# Patient Record
Sex: Female | Born: 1945 | Race: White | Hispanic: No | Marital: Single | Smoking: Former smoker
Health system: Southern US, Community
[De-identification: ages and names within clinical notes are randomized; demographics above are authoritative.]

## PROBLEM LIST (undated history)

## (undated) DIAGNOSIS — T7840XA Allergy, unspecified, initial encounter: Secondary | ICD-10-CM

## (undated) DIAGNOSIS — E785 Hyperlipidemia, unspecified: Secondary | ICD-10-CM

## (undated) DIAGNOSIS — G4730 Sleep apnea, unspecified: Secondary | ICD-10-CM

## (undated) DIAGNOSIS — F419 Anxiety disorder, unspecified: Secondary | ICD-10-CM

## (undated) DIAGNOSIS — R519 Headache, unspecified: Secondary | ICD-10-CM

## (undated) DIAGNOSIS — G43909 Migraine, unspecified, not intractable, without status migrainosus: Secondary | ICD-10-CM

## (undated) HISTORY — DX: Hyperlipidemia, unspecified: E78.5

## (undated) HISTORY — DX: Sleep apnea, unspecified: G47.30

## (undated) HISTORY — PX: ABDOMINAL HYSTERECTOMY: SHX81

## (undated) HISTORY — DX: Migraine, unspecified, not intractable, without status migrainosus: G43.909

## (undated) HISTORY — DX: Headache, unspecified: R51.9

## (undated) HISTORY — DX: Anxiety disorder, unspecified: F41.9

## (undated) HISTORY — DX: Allergy, unspecified, initial encounter: T78.40XA

---

## 1987-02-07 HISTORY — PX: BACK SURGERY: SHX140

## 2013-09-13 HISTORY — PX: HERNIA REPAIR: SHX51

## 2019-01-15 HISTORY — PX: REPLACEMENT TOTAL KNEE: SUR1224

## 2019-11-10 ENCOUNTER — Other Ambulatory Visit: Payer: Self-pay

## 2019-11-10 ENCOUNTER — Encounter: Payer: Self-pay | Admitting: Family Medicine

## 2019-11-10 ENCOUNTER — Ambulatory Visit (INDEPENDENT_AMBULATORY_CARE_PROVIDER_SITE_OTHER): Payer: Medicare Other | Admitting: Family Medicine

## 2019-11-10 VITALS — BP 113/74 | HR 73 | Temp 97.7°F | Resp 16 | Ht 61.0 in | Wt 228.6 lb

## 2019-11-10 DIAGNOSIS — Z1231 Encounter for screening mammogram for malignant neoplasm of breast: Secondary | ICD-10-CM

## 2019-11-10 DIAGNOSIS — Z23 Encounter for immunization: Secondary | ICD-10-CM | POA: Diagnosis not present

## 2019-11-10 DIAGNOSIS — G4733 Obstructive sleep apnea (adult) (pediatric): Secondary | ICD-10-CM

## 2019-11-10 DIAGNOSIS — Z7689 Persons encountering health services in other specified circumstances: Secondary | ICD-10-CM

## 2019-11-10 DIAGNOSIS — M810 Age-related osteoporosis without current pathological fracture: Secondary | ICD-10-CM

## 2019-11-10 DIAGNOSIS — M159 Polyosteoarthritis, unspecified: Secondary | ICD-10-CM

## 2019-11-10 DIAGNOSIS — M8949 Other hypertrophic osteoarthropathy, multiple sites: Secondary | ICD-10-CM

## 2019-11-10 DIAGNOSIS — F3342 Major depressive disorder, recurrent, in full remission: Secondary | ICD-10-CM

## 2019-11-10 DIAGNOSIS — R0600 Dyspnea, unspecified: Secondary | ICD-10-CM

## 2019-11-10 DIAGNOSIS — R0609 Other forms of dyspnea: Secondary | ICD-10-CM

## 2019-11-10 DIAGNOSIS — Z6841 Body Mass Index (BMI) 40.0 and over, adult: Secondary | ICD-10-CM

## 2019-11-10 DIAGNOSIS — R0789 Other chest pain: Secondary | ICD-10-CM

## 2019-11-10 MED ORDER — ALENDRONATE SODIUM 70 MG PO TABS: 70.0000 mg | ORAL_TABLET | ORAL | 3 refills | Status: DC

## 2019-11-10 NOTE — Assessment & Plan Note (Signed)
Controlled, in remission Continue Duloxetine 20mg  BID, checked prior meds Will refill when ready

## 2019-11-10 NOTE — Assessment & Plan Note (Signed)
Known OSA, not on CPAP Currently doing well without complications Reconsider future PSG

## 2019-11-10 NOTE — Assessment & Plan Note (Signed)
Chronic problem osteoporosis Lumbar spine Overdue for DEXA On 3 years+ of bisphosphonate oral Alendronate 70mg  weekly, re order today, discussion on timeline 5 years then drug holiday and re-eval - Ordered DEXA [facility]

## 2019-11-10 NOTE — Assessment & Plan Note (Addendum)
Chronic problem, multiple joints Chronic pain underlying Knees, back primarily S/p L knee replacement future R knee likely need surgery Advised safe Tylenol dosing PRN

## 2019-11-10 NOTE — Progress Notes (Addendum)
Subjective:    Patient ID: Diana Mcneil, female    DOB: [birth year], 74 y.o.   MRN: [id number]  Quaneisha Hanisch is a 74 y.o. female presenting on 11/10/2019 for Obesity and Osteoporosis  Previously PCP Dr Jefm Bryant in [place]. Request records. She was living alone for while, previous ex-husband. Now has new partner and doing very well, they have moved to this area by preference.   HPI   Morbid obesity BMI >43 Atypical Chest Pain / DOE Fam history heart disease  Reports today that she has had history of prior stress test work up years ago in [place] mostly unremarkable, and now she requests re-evaluation. She admits with her age and weight she would like cardiac evaluation. 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 On Duloxetine 20mg  BID, in remission, no significant concerns.  Migraines Sumatriptan PRN  Allergies Off Xyzal On Singulair, Flonase   Health Maintenance: Declines COVID19 vaccine.  Last mammogram years ago.  Unsure last date of PNA vaccine, she thinks after age 58+, will request record from previous PCP.  Due for Flu Shot, will receive today    last colonoscopy 3-4 year ago was told last one. Need record  Depression screen [facility] 2/9 11/10/2019  Decreased Interest 0  Down, Depressed, Hopeless 0  PHQ - 2 Score 0  Altered  sleeping 1  Tired, decreased energy 3  Change in appetite 0  Feeling bad or failure about yourself  0  Trouble concentrating 0  Moving slowly or fidgety/restless 0  Suicidal thoughts 0  PHQ-9 Score 4  Difficult doing work/chores Not difficult at all     GAD 7 : Generalized Anxiety Score 11/10/2019  Nervous, Anxious, on Edge 0  Control/stop worrying 0  Worry too much - different things 0  Trouble relaxing 0  Restless 0  Easily annoyed or irritable 0  Afraid - awful might happen 0  Total GAD 7 Score 0  Anxiety Difficulty Not difficult at all        Past Medical History:  Diagnosis Date  . Allergy   . Anxiety   . Headache   . Hyperlipidemia   . Migraine   . Sleep apnea    Past Surgical History:  Procedure Laterality Date  . ABDOMINAL HYSTERECTOMY     partial  . BACK SURGERY  1989  . HERNIA REPAIR  09/13/2013  . 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 Marital status: Single    Spouse name: Not on file  . Number of children: Not on file  . Years of education: high school  . Highest education level: High school graduate  Occupational History  . Not on file  Tobacco Use  . Smoking status: Former Smoker    Packs/day: 0.50    Years: 40.00    Pack years: 20.00  Types: Cigarettes  . Smokeless tobacco: Former Network engineer and Sexual Activity  . Alcohol use: Never  . Drug use: Never  . Sexual activity: Not on file  Other Topics Concern  . Not on file  Social History Narrative  . Not on file   Social Determinants of Health   Financial Resource Strain:   . Difficulty of Paying Living Expenses: Not on file  Food Insecurity:   . Worried About Charity fundraiser in the Last Year: Not on file  . Ran Out of Food in the Last Year: Not on file  Transportation Needs:   . Lack of Transportation (Medical): Not on file  . Lack of Transportation (Non-Medical): Not on file  Physical Activity:   . Days of Exercise per  Week: Not on file  . Minutes of Exercise per Session: Not on file  Stress:   . Feeling of Stress : Not on file  Social Connections:   . Frequency of Communication with Friends and Family: Not on file  . Frequency of Social Gatherings with Friends and Family: Not on file  . Attends Religious Services: Not on file  . Active Member of Clubs or Organizations: Not on file  . Attends Archivist Meetings: Not on file  . Marital Status: Not on file  Intimate Partner Violence:   . Fear of Current or Ex-Partner: Not on file  . Emotionally Abused: Not on file  . Physically Abused: Not on file  . Sexually Abused: Not on file   Family History  Problem Relation Age of Onset  . Diabetes Mother   . Heart disease Mother   . Stroke Mother   . CAD Mother   . Diabetes Father    Current Outpatient Medications on File Prior to Visit  Medication Sig  . ascorbic acid (VITAMIN C) 500 MG tablet Take by mouth.  Marland Kitchen aspirin 81 MG EC tablet Take by mouth.  . Cholecalciferol 25 MCG (1000 UT) tablet Take by mouth.  . DULoxetine (CYMBALTA) 20 MG capsule Take 20 mg by mouth in the morning and at bedtime.  . fluticasone (FLONASE) 50 MCG/ACT nasal spray Place 2 sprays into both nostrils daily.  . montelukast (SINGULAIR) 10 MG tablet Take 10 mg by mouth daily.  . pantoprazole (PROTONIX) 40 MG tablet Take 40 mg by mouth daily.  . rosuvastatin (CRESTOR) 20 MG tablet Crestor  . SUMAtriptan (IMITREX) 100 MG tablet    No current facility-administered medications on file prior to visit.    Review of Systems Per HPI unless specifically indicated above     Objective:    BP 113/74   Pulse 73   Temp 97.7 F (36.5 C) (Temporal)   Resp 16   Ht 5\' 1"  (1.549 m)   Wt 228 lb 9.6 oz (103.7 kg)   SpO2 97%   BMI 43.19 kg/m   Wt Readings from Last 3 Encounters:  11/10/19 228 lb 9.6 oz (103.7 kg)    Physical Exam Vitals and nursing note reviewed.  Constitutional:      General: She is not in acute  distress.    Appearance: She is well-developed. She is obese. She is not diaphoretic.     Comments: Well-appearing, comfortable, cooperative  HENT:     Head: Normocephalic and atraumatic.  Eyes:     General:        Right eye: No discharge.        Left eye: No discharge.     Conjunctiva/sclera:  Conjunctivae normal.  Neck:     Thyroid: No thyromegaly.  Cardiovascular:     Rate and Rhythm: Normal rate and regular rhythm.     Heart sounds: Normal heart sounds. No murmur heard.   Pulmonary:     Effort: Pulmonary effort is normal. No respiratory distress.     Breath sounds: Normal breath sounds. No wheezing or rales.  Chest:     Chest wall: No tenderness.  Musculoskeletal:        General: Normal range of motion.     Cervical back: Normal range of motion and neck supple.     Right lower leg: Edema (varicose veins) present.     Left lower leg: Edema (varicose veins) present.     Comments: Uses cane  Lymphadenopathy:     Cervical: No cervical adenopathy.  Skin:    General: Skin is warm and dry.     Findings: No erythema or rash.  Neurological:     Mental Status: She is alert and oriented to person, place, and time.  Psychiatric:        Behavior: Behavior normal.     Comments: Well groomed, good eye contact, normal speech and thoughts    No results found for this or any previous visit.    Assessment & Plan:   Problem List Items Addressed This Visit    Primary osteoarthritis involving multiple joints    Chronic problem, multiple joints Chronic pain underlying Knees, back primarily S/p L knee replacement future R knee likely need surgery Advised safe Tylenol dosing PRN      Relevant Medications   aspirin 81 MG EC tablet   Osteoporosis of lumbar spine - Primary    Chronic problem osteoporosis Lumbar spine Overdue for DEXA On 3 years+ of bisphosphonate oral Alendronate 70mg  weekly, re order today, discussion on timeline 5 years then drug holiday and re-eval - Ordered DEXA  [facility]      Relevant Medications   Cholecalciferol 25 MCG (1000 UT) tablet   alendronate (FOSAMAX) 70 MG tablet   Other Relevant Orders   DG Bone Density   OSA (obstructive sleep apnea)    Known OSA, not on CPAP Currently doing well without complications Reconsider future PSG      Morbid obesity with BMI of 40.0-44.9, adult (HCC)    Encourage lifestyle diet exercise wt loss as tolerated      Relevant Orders   Ambulatory referral to Cardiology   Major depression, recurrent, full remission ([place])    Controlled, in remission Continue Duloxetine 20mg  BID, checked prior meds Will refill when ready      Relevant Medications   DULoxetine (CYMBALTA) 20 MG capsule    Other Visit Diagnoses    Encounter to establish care with new doctor       Needs flu shot       Relevant Orders   Flu Vaccine QUAD High Dose(Fluad) (Completed)   Encounter for screening mammogram for malignant neoplasm of breast       Relevant Orders   MM DIGITAL SCREENING BILATERAL   Atypical chest pain       Relevant Orders   Ambulatory referral to Cardiology   Dyspnea on exertion       Relevant Orders   Ambulatory referral to Cardiology      #Dyspnea on Exertion, Atypical CP Refer to Cardiology [facility] given her constellation of symptoms, with risk factors age 62, morbid obesity and fam history of heart disease.  Ordered mammogram screening  Request  all records from prior PCP Dr Carley Hammed in [place], and Dr Riley Lam - [place] (GI specialist)    Orders Placed This Encounter  Procedures  . MM DIGITAL SCREENING BILATERAL    Standing Status:   Future    Standing Expiration Date:   05/10/2020    Order Specific Question:   Reason for Exam (SYMPTOM  OR DIAGNOSIS REQUIRED)    Answer:   Routine screening bilateral mammogram. May change to Bilateral MM Tomo screening if indicated and appropriate for patient/insurance    Order Specific Question:   Preferred imaging location?    Answer:   [facility]  . DG Bone Density    Standing Status:   Future    Standing Expiration Date:   11/09/2020    Order Specific Question:   Reason for Exam (SYMPTOM  OR DIAGNOSIS REQUIRED)    Answer:   osteoporosis lumbar spine, on alendronate for 3 years +    Order Specific Question:   Preferred imaging location?    Answer:   [facility]  . Flu Vaccine QUAD High Dose(Fluad)  . Ambulatory referral to Cardiology    Referral Priority:   Routine    Referral Type:   Consultation    Referral Reason:   Specialty Services Required    Requested Specialty:   Cardiology    Number of Visits Requested:   1     Meds ordered this encounter  Medications  . alendronate (FOSAMAX) 70 MG tablet    Sig: Take 1 tablet (70 mg total) by mouth once a week. Take with a full glass of water on an empty stomach.    Dispense:  12 tablet    Refill:  3      Follow up plan: Return in about 4 weeks (around 12/08/2019) for 4 week follow-up review records, cardiology f/u, AM apt may do fasting lab after.  Nobie Putnam, DO [facility] 11/10/2019, 10:44 AM

## 2019-11-10 NOTE — Patient Instructions (Addendum)
Thank you for coming to the office today.  Recommend to start taking Tylenol Extra Strength 500mg  tabs - take 1 to 2 tabs per dose (max 1000mg ) every 6-8 hours for pain (take regularly, don't skip a dose for next 7 days), max 24 hour daily dose is 6 tablets or 3000mg . In the future you can repeat the same everyday Tylenol course for 1-2 weeks at a time.    For Mammogram screening for breast cancer / DEXA Scan (Bone mineral density) screening for osteoporosis  Call the [place] below anytime to schedule your own appointment now that order has been placed.  [facility] [address] [place], [place] [address] Phone: [phone]  ---------------------------------------------  Stay tuned for apt  [facility]) HeartCare at [place],  [address] Main: [phone]   DUE for [place] (no food or drink after midnight before the lab appointment, only water or coffee without cream/sugar on the morning of)   Please schedule a Follow-up Appointment to: Return in about 4 weeks (around 12/08/2019) for 4 week follow-up review records, cardiology f/u, AM apt may do fasting lab after.  If you have any other questions or concerns, please feel free to call the office or send a message through [place]. You may also schedule an earlier appointment if necessary.  Additionally, you may be receiving a survey about your experience at our office within a few days to 1 week by e-mail or mail. We value your feedback.  Diana Putnam, DO [place]

## 2019-11-10 NOTE — Assessment & Plan Note (Signed)
Encourage lifestyle diet exercise wt loss as tolerated

## 2019-11-19 DIAGNOSIS — M1711 Unilateral primary osteoarthritis, right knee: Secondary | ICD-10-CM | POA: Diagnosis not present

## 2019-12-01 ENCOUNTER — Ambulatory Visit: Payer: Medicare Other | Admitting: Cardiology

## 2019-12-11 LAB — HEMOGLOBIN A1C: Hemoglobin A1C: 5.1

## 2019-12-12 ENCOUNTER — Ambulatory Visit: Payer: Self-pay | Admitting: Family Medicine

## 2019-12-16 ENCOUNTER — Encounter: Payer: Self-pay | Admitting: Family Medicine

## 2019-12-19 ENCOUNTER — Other Ambulatory Visit: Payer: Self-pay

## 2019-12-19 ENCOUNTER — Ambulatory Visit (INDEPENDENT_AMBULATORY_CARE_PROVIDER_SITE_OTHER): Payer: Medicare Other | Admitting: Family Medicine

## 2019-12-19 ENCOUNTER — Encounter: Payer: Self-pay | Admitting: Family Medicine

## 2019-12-19 ENCOUNTER — Ambulatory Visit: Payer: Self-pay | Admitting: Family Medicine

## 2019-12-19 VITALS — BP 129/67 | HR 62 | Temp 97.7°F | Resp 16 | Ht 61.0 in | Wt 227.0 lb

## 2019-12-19 DIAGNOSIS — M25561 Pain in right knee: Secondary | ICD-10-CM | POA: Diagnosis not present

## 2019-12-19 DIAGNOSIS — M1711 Unilateral primary osteoarthritis, right knee: Secondary | ICD-10-CM | POA: Diagnosis not present

## 2019-12-19 DIAGNOSIS — G8929 Other chronic pain: Secondary | ICD-10-CM | POA: Diagnosis not present

## 2019-12-19 MED ORDER — GABAPENTIN 100 MG PO CAPS: ORAL_CAPSULE | ORAL | 1 refills | Status: DC

## 2019-12-19 NOTE — Patient Instructions (Addendum)
Thank you for coming to the office today.  Start Gabapentin 100mg  capsules, take at night for 2-3 nights only, and then increase to 2 times a day for a few days, and then may increase to 3 times a day, it may make you drowsy, if helps significantly at night only, then you can increase instead to 3 capsules at night, instead of 3 times a day  - In the future if needed, we can significantly increase the dose if tolerated well, some common doses are 300mg  three times a day up to 600mg  three times a day, usually it takes several weeks or months to get to higher doses  Try Prevagen.   Please schedule a Follow-up Appointment to: Return in about 3 months (around 03/20/2020) for 3 month follow-up Arthritis, Pain.  If you have any other questions or concerns, please feel free to call the office or send a message through [place]. You may also schedule an earlier appointment if necessary.  Additionally, you may be receiving a survey about your experience at our office within a few days to 1 week by e-mail or mail. We value your feedback.  Nobie Putnam, DO [place]

## 2019-12-19 NOTE — Progress Notes (Signed)
Subjective:    Patient ID: Diana Mcneil, female    DOB: 05/06/1945, 74 y.o.   MRN: [id number]  Diana Mcneil is a 74 y.o. female presenting on 12/19/2019 for Arthritis (knee pain bilateral)   HPI   RIGHT knee pain, chronic / osteoarthritis Chronic Pain / Osteoarthritis multiple joints  History - December 2020, s/p Left knee replaced Previously discussed - She needs to get R knee replaced - has chronic pain  Admits chronic back pain and prior scoliosis She uses cane for ambulation  Had prior x-rays of Right knee, very severe, and had injection cortisone R knee ineffective only lasted 3 weeks, no further injections would need surgery in future. Chronic low back pain - had imaging. Uses cane for ambulation Worse if rains has severe arthritis pain Previously on Tramadol, has no longer been helpful. She is hesitant about stronger medication. She plans to continue to see Dr Clarise Cruz at [facility] ([place]) and plans to do surgery at [facility] and PT locally here in [place]. Next apt with Orthopedic is next month in December 2021 She is taking Duloxetine for  Mood and pain. She has taken Oxycodone in past from prior surgery. She is not interested in other opiate or stronger med. She asks for other options. Has tried most NSAID therapy in past.  Atypical Chest Pain / DOE Fam history heart disease - Had apt with Cardiology but missed due to arrived late, she re-scheduled now for Monday 11/15    Depression screen PHQ 2/9 11/10/2019  Decreased Interest 0  Down, Depressed, Hopeless 0  PHQ - 2 Score 0  Altered sleeping 1  Tired, decreased energy 3  Change in appetite 0  Feeling bad or failure about yourself  0  Trouble concentrating 0  Moving slowly or fidgety/restless 0  Suicidal thoughts 0  PHQ-9 Score 4  Difficult doing work/chores Not difficult at all    Social History   Tobacco Use  . Smoking status: Former Smoker    Packs/day: 0.50    Years: 40.00    Pack years:  20.00    Types: Cigarettes  . Smokeless tobacco: Former Network engineer Use Topics  . Alcohol use: Never  . Drug use: Never    Review of Systems Per HPI unless specifically indicated above     Objective:    BP 129/67   Pulse 62   Temp 97.7 F (36.5 C) (Temporal)   Resp 16   Ht 5\' 1"  (1.549 m)   Wt 227 lb (103 kg)   SpO2 95%   BMI 42.89 kg/m   Wt Readings from Last 3 Encounters:  12/19/19 227 lb (103 kg)  11/10/19 228 lb 9.6 oz (103.7 kg)    Physical Exam Vitals and nursing note reviewed.  Constitutional:      General: She is not in acute distress.    Appearance: She is well-developed. She is obese. She is not diaphoretic.     Comments: Well-appearing, comfortable, cooperative  HENT:     Head: Normocephalic and atraumatic.  Eyes:     General:        Right eye: No discharge.        Left eye: No discharge.     Conjunctiva/sclera: Conjunctivae normal.  Cardiovascular:     Rate and Rhythm: Normal rate.  Pulmonary:     Effort: Pulmonary effort is normal.  Genitourinary:    Rectum: Guaiac stool:    Musculoskeletal:     Comments: Right Knee very bulky  appearance, effusion, tender to palpation joint line. Uses cane for ambulation. Crepitus on exam ROM.  Skin:    General: Skin is warm and dry.     Findings: No erythema or rash.  Neurological:     Mental Status: She is alert and oriented to person, place, and time.  Psychiatric:        Behavior: Behavior normal.     Comments: Well groomed, good eye contact, normal speech and thoughts    Results for orders placed or performed in visit on 12/16/19  Hemoglobin A1c  Result Value Ref Range   Hemoglobin A1C 5.1       Assessment & Plan:   Problem List Items Addressed This Visit    Primary osteoarthritis of right knee   Relevant Medications   gabapentin (NEURONTIN) 100 MG capsule   Chronic pain of right knee - Primary   Relevant Medications   gabapentin (NEURONTIN) 100 MG capsule      Subacute on chronic R  generalized Knee pain and swelling without known injury or trauma - But known knee OA/DJD underlying severe. S/p L knee replacement - Dr Clarise Cruz ([facility] [place]) Not responding to conservative therapy and rx management, failed Tramadol NSAIDs cortisone injections  Plan: 1. Start Gabapentin 100mg  titration as advised, reviewed dosing per AVS / rx given 2. Tylenol 3. RICE therapy (rest, ice, compression, elevation) for swelling, activity modification  Defer imaging, already done by Ortho  Offered Hydrocodone temporary course but she declined. Can reconsider as a 1 time rx temporary prior to upcoming potential R knee surgery  She will return to [facility] Dr Elgie Congo in December 2021 likely pursue knee replacement.  Meds ordered this encounter  Medications  . gabapentin (NEURONTIN) 100 MG capsule    Sig: Start 1 capsule daily, increase by 1 cap every 2-3 days as tolerated up to 3 times a day, or may take 3 at once in evening.    Dispense:  90 capsule    Refill:  1      Follow up plan: Return in about 3 months (around 03/20/2020) for 3 month follow-up Arthritis, Pain.   Nobie Putnam, DO [facility] 12/19/2019, 10:56 AM

## 2019-12-22 ENCOUNTER — Ambulatory Visit (INDEPENDENT_AMBULATORY_CARE_PROVIDER_SITE_OTHER): Payer: Medicare Other

## 2019-12-22 ENCOUNTER — Encounter: Payer: Self-pay | Admitting: Cardiology

## 2019-12-22 ENCOUNTER — Other Ambulatory Visit: Payer: Self-pay

## 2019-12-22 ENCOUNTER — Ambulatory Visit: Payer: Medicare Other | Admitting: Cardiology

## 2019-12-22 VITALS — BP 124/74 | HR 61 | Ht 61.0 in | Wt 228.1 lb

## 2019-12-22 DIAGNOSIS — R002 Palpitations: Secondary | ICD-10-CM

## 2019-12-22 DIAGNOSIS — R0600 Dyspnea, unspecified: Secondary | ICD-10-CM | POA: Diagnosis not present

## 2019-12-22 DIAGNOSIS — G4733 Obstructive sleep apnea (adult) (pediatric): Secondary | ICD-10-CM

## 2019-12-22 DIAGNOSIS — E7800 Pure hypercholesterolemia, unspecified: Secondary | ICD-10-CM | POA: Diagnosis not present

## 2019-12-22 DIAGNOSIS — R0609 Other forms of dyspnea: Secondary | ICD-10-CM

## 2019-12-22 NOTE — Patient Instructions (Signed)
Medication Instructions:   Your physician recommends that you continue on your current medications as directed. Please refer to the Current Medication list given to you today.  *If you need a refill on your cardiac medications before your next appointment, please call your pharmacy*   Lab Work: None Ordered  If you have labs (blood work) drawn today and your tests are completely normal, you will receive your results only by: Marland Kitchen MyChart Message (if you have MyChart) OR . A paper copy in the mail If you have any lab test that is abnormal or we need to change your treatment, we will call you to review the results.   Testing/Procedures:  1.  Your physician has requested that you have an echocardiogram. Echocardiography is a painless test that uses sound waves to create images of your heart. It provides your doctor with information about the size and shape of your heart and how well your heart's chambers and valves are working. This procedure takes approximately one hour. There are no restrictions for this procedure.  2.  Your physician has recommended that you wear a Zio monitor. This monitor is a medical device that records the heart's electrical activity. Doctors most often use these monitors to diagnose arrhythmias. Arrhythmias are problems with the speed or rhythm of the heartbeat. The monitor is a small device applied to your chest. You can wear one while you do your normal daily activities. While wearing this monitor if you have any symptoms to push the button and record what you felt. Once you have worn this monitor for the period of time provider prescribed (Usually 14 days), you will return the monitor device in the postage paid box. Once it is returned they will download the data collected and provide [place] with a report which the provider will then review and we will call you with those results. Important tips:  1. Avoid showering during the first 24 hours of wearing the monitor. 2. Avoid  excessive sweating to help maximize wear time. 3. Do not submerge the device, no hot tubs, and no swimming pools. 4. Keep any lotions or oils away from the patch. 5. After 24 hours you may shower with the patch on. Take brief showers with your back facing the shower head.  6. Do not remove patch once it has been placed because that will interrupt data and decrease adhesive wear time. 7. Push the button when you have any symptoms and write down what you were feeling. 8. Once you have completed wearing your monitor, remove and place into box which has postage paid and place in your outgoing mailbox.  9. If for some reason you have misplaced your box then call our office and we can provide another box and/or mail it off for you.         Follow-Up: At [facility], you and your health needs are our priority.  As part of our continuing mission to provide you with exceptional heart care, we have created designated Provider Care Teams.  These Care Teams include your primary Cardiologist (physician) and Advanced Practice Providers (APPs -  Physician Assistants and Nurse Practitioners) who all work together to provide you with the care you need, when you need it.  We recommend signing up for the patient portal called "MyChart".  Sign up information is provided on this After Visit Summary.  MyChart is used to connect with patients for Virtual Visits (Telemedicine).  Patients are able to view lab/test results, encounter notes, upcoming appointments,  etc.  Non-urgent messages can be sent to your provider as well.   To learn more about what you can do with MyChart, go to [email or web address].    Your next appointment:   5 weeks   The format for your next appointment:   In Person  Provider:   Kate Sable, MD   Other Instructions

## 2019-12-22 NOTE — Progress Notes (Signed)
Cardiology Office Note:    Date:  12/22/2019   ID:  Diana Mcneil, DOB [birth year], MRN [id number]  PCP:  Olin Hauser, DO  [place] Cardiologist:  Kate Sable, MD  [facility] Electrophysiologist:  None   Referring MD: Nobie Putnam *   Chief Complaint  Patient presents with  . OTHER    Hx CAD c/o leg and back pain. Meds reviewed verbally with pt.    History of Present Illness:    Diana Mcneil is a 74 y.o. female with a hx of hyperlipidemia, obesity, OSA, former smoker x30 years who presents due to palpitations.  Patient states having symptoms of palpitations on and off last occurrence 3 months ago.  She is worried since her mom had history of strokes and eventually passed.  She also endorses daytime somnolence and fatigue.  Has dyspnea on exertion, denies chest pain.  Was diagnosed with sleep apnea 30 years ago, did not use CPAP mask due to cost issues.  She denies any history of heart disease, states her mom had CABG in her 19s.   Past Medical History:  Diagnosis Date  . Allergy   . Anxiety   . Headache   . Hyperlipidemia   . Migraine   . Sleep apnea     Past Surgical History:  Procedure Laterality Date  . ABDOMINAL HYSTERECTOMY     partial  . BACK SURGERY  1989  . HERNIA REPAIR  09/13/2013  . 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 alendronate (FOSAMAX) 70 MG tablet Take 1 tablet (70 mg total) by mouth once a week. Take with a full glass of water on an empty stomach.  Marland Kitchen amoxicillin (AMOXIL) 500 MG capsule Prn for dental procedures.  Marland Kitchen ascorbic acid (VITAMIN C) 500 MG tablet Take by mouth.  Marland Kitchen aspirin 81 MG EC tablet Take by mouth.  . Cholecalciferol 25 MCG (1000 UT) tablet Take by mouth.  . DULoxetine (CYMBALTA) 20 MG capsule Take 20 mg by mouth in the morning and at bedtime.  . fluticasone (FLONASE) 50 MCG/ACT nasal spray Place 2 sprays into both nostrils daily.  Marland Kitchen  gabapentin (NEURONTIN) 100 MG capsule Start 1 capsule daily, increase by 1 cap every 2-3 days as tolerated up to 3 times a day, or may take 3 at once in evening.  . montelukast (SINGULAIR) 10 MG tablet Take 10 mg by mouth daily.  . pantoprazole (PROTONIX) 40 MG tablet Take 40 mg by mouth daily.  . rosuvastatin (CRESTOR) 20 MG tablet Take 20 mg by mouth daily.   . SUMAtriptan (IMITREX) 100 MG tablet Take 100 mg by mouth in the morning and at bedtime.      Allergies:   Mushroom extract complex, [place], and Sulfa antibiotics   Social History   Socioeconomic History  . Marital status: Single    Spouse name: Not on file  . Number of children: Not on file  . Years of education: high school  . Highest education level: High school graduate  Occupational History  . Not on file  Tobacco Use  . Smoking status: Former Smoker    Packs/day: 0.50    Years: 40.00    Pack years: 20.00    Types: Cigarettes  . Smokeless tobacco: Former Network engineer and Sexual Activity  . Alcohol use: Never  . Drug use: Never  . Sexual activity: Not on file  Other Topics Concern  . Not on file  Social History Narrative  . Not on file   Social Determinants of Health   Financial Resource Strain:   . Difficulty of Paying Living Expenses: Not on file  Food Insecurity:   . Worried About Charity fundraiser in the Last Year: Not on file  . Ran Out of Food in the Last Year: Not on file  Transportation Needs:   . Lack of Transportation (Medical): Not on file  . Lack of Transportation (Non-Medical): Not on file  Physical Activity:   . Days of Exercise per Week: Not on file  . Minutes of Exercise per Session: Not on file  Stress:   . Feeling of Stress : Not on file  Social Connections:   . Frequency of Communication with Friends and Family: Not on file  . Frequency of Social Gatherings with Friends and Family: Not on file  . Attends Religious Services: Not on file  . Active Member of Clubs or Organizations:  Not on file  . Attends Archivist Meetings: Not on file  . Marital Status: Not on file     Family History: The patient's family history includes CAD in her mother; Diabetes in her father and mother; Heart disease in her mother; Stroke in her mother.  ROS:   Please see the history of present illness.     All other systems reviewed and are negative.  EKGs/Labs/Other Studies Reviewed:    The following studies were reviewed today:   EKG:  EKG is  ordered today.  The ekg ordered today demonstrates normal sinus rhythm, normal ECG.  Recent Labs: No results found for requested labs within last 8760 hours.  Recent Lipid Panel No results found for: CHOL, TRIG, HDL, CHOLHDL, VLDL, LDLCALC, LDLDIRECT   Risk Assessment/Calculations:      Physical Exam:    VS:  BP 124/74 (BP Location: Right Arm, Patient Position: Sitting, Cuff Size: Large)   Pulse 61   Ht 5\' 1"  (1.549 m)   Wt 228 lb 2 oz (103.5 kg)   SpO2 98%   BMI 43.10 kg/m     Wt Readings from Last 3 Encounters:  12/22/19 228 lb 2 oz (103.5 kg)  12/19/19 227 lb (103 kg)  11/10/19 228 lb 9.6 oz (103.7 kg)     GEN:  Well nourished, well developed in no acute distress HEENT: Normal NECK: No JVD; No carotid bruits LYMPHATICS: No lymphadenopathy CARDIAC: RRR, no murmurs, rubs, gallops RESPIRATORY:  Clear to auscultation without rales, wheezing or rhonchi  ABDOMEN: Soft, non-tender, distended MUSCULOSKELETAL:  No edema; No deformity  SKIN: Warm and dry NEUROLOGIC:  Alert and oriented x 3 PSYCHIATRIC:  Normal affect   ASSESSMENT:    1. Dyspnea on exertion   2. Palpitations   3. OSA (obstructive sleep apnea)   4. Pure hypercholesterolemia    PLAN:    In order of problems listed above:  1. Patient with dyspnea on exertion.  I do not think this is an anginal equivalent.  Obesity, untreated OSA likely contributing.  Get echo to evaluate systolic and diastolic function.  2. Patient with palpitations, placed  2-week cardiac monitor to evaluate arrhythmia such as A. fib or flutter. 3. Patient with history of OSA, not on CPAP.  Endorses daytime somnolence, fatigue.  Refer to pulmonary medicine for OSA eval and treatment. 4. History of hyperlipidemia, continue statin.  Follow-up after echo and cardiac monitor.    Shared Decision Making/Informed Consent       Medication Adjustments/Labs and Tests  Ordered: Current medicines are reviewed at length with the patient today.  Concerns regarding medicines are outlined above.  Orders Placed This Encounter  Procedures  . Ambulatory referral to Pulmonology  . EKG 12-Lead  . ECHOCARDIOGRAM COMPLETE   No orders of the defined types were placed in this encounter.   Patient Instructions  Medication Instructions:   Your physician recommends that you continue on your current medications as directed. Please refer to the Current Medication list given to you today.  *If you need a refill on your cardiac medications before your next appointment, please call your pharmacy*   Lab Work: None Ordered  If you have labs (blood work) drawn today and your tests are completely normal, you will receive your results only by: Marland Kitchen MyChart Message (if you have MyChart) OR . A paper copy in the mail If you have any lab test that is abnormal or we need to change your treatment, we will call you to review the results.   Testing/Procedures:  1.  Your physician has requested that you have an echocardiogram. Echocardiography is a painless test that uses sound waves to create images of your heart. It provides your doctor with information about the size and shape of your heart and how well your heart's chambers and valves are working. This procedure takes approximately one hour. There are no restrictions for this procedure.  2.  Your physician has recommended that you wear a Zio monitor. This monitor is a medical device that records the heart's electrical activity. Doctors  most often use these monitors to diagnose arrhythmias. Arrhythmias are problems with the speed or rhythm of the heartbeat. The monitor is a small device applied to your chest. You can wear one while you do your normal daily activities. While wearing this monitor if you have any symptoms to push the button and record what you felt. Once you have worn this monitor for the period of time provider prescribed (Usually 14 days), you will return the monitor device in the postage paid box. Once it is returned they will download the data collected and provide [place] with a report which the provider will then review and we will call you with those results. Important tips:  1. Avoid showering during the first 24 hours of wearing the monitor. 2. Avoid excessive sweating to help maximize wear time. 3. Do not submerge the device, no hot tubs, and no swimming pools. 4. Keep any lotions or oils away from the patch. 5. After 24 hours you may shower with the patch on. Take brief showers with your back facing the shower head.  6. Do not remove patch once it has been placed because that will interrupt data and decrease adhesive wear time. 7. Push the button when you have any symptoms and write down what you were feeling. 8. Once you have completed wearing your monitor, remove and place into box which has postage paid and place in your outgoing mailbox.  9. If for some reason you have misplaced your box then call our office and we can provide another box and/or mail it off for you.         Follow-Up: At [facility], you and your health needs are our priority.  As part of our continuing mission to provide you with exceptional heart care, we have created designated Provider Care Teams.  These Care Teams include your primary Cardiologist (physician) and Advanced Practice Providers (APPs -  Physician Assistants and Nurse Practitioners) who all work together to  provide you with the care you need, when you need it.  We  recommend signing up for the patient portal called "MyChart".  Sign up information is provided on this After Visit Summary.  MyChart is used to connect with patients for Virtual Visits (Telemedicine).  Patients are able to view lab/test results, encounter notes, upcoming appointments, etc.  Non-urgent messages can be sent to your provider as well.   To learn more about what you can do with MyChart, go to [email or web address].    Your next appointment:   5 weeks   The format for your next appointment:   In Person  Provider:   Kate Sable, MD   Other Instructions      Signed, Kate Sable, MD  12/22/2019 12:40 PM    [place]

## 2020-01-15 ENCOUNTER — Other Ambulatory Visit: Payer: Self-pay

## 2020-01-15 ENCOUNTER — Ambulatory Visit (INDEPENDENT_AMBULATORY_CARE_PROVIDER_SITE_OTHER): Payer: Medicare Other

## 2020-01-15 DIAGNOSIS — R0600 Dyspnea, unspecified: Secondary | ICD-10-CM

## 2020-01-15 DIAGNOSIS — R002 Palpitations: Secondary | ICD-10-CM | POA: Diagnosis not present

## 2020-01-15 DIAGNOSIS — R0609 Other forms of dyspnea: Secondary | ICD-10-CM

## 2020-01-15 LAB — ECHOCARDIOGRAM COMPLETE
Area-P 1/2: 2.44 cm2
P 1/2 time: 641 msec
S' Lateral: 3.3 cm

## 2020-01-16 ENCOUNTER — Other Ambulatory Visit: Payer: Self-pay

## 2020-01-16 ENCOUNTER — Telehealth: Payer: Self-pay | Admitting: *Deleted

## 2020-01-16 DIAGNOSIS — R002 Palpitations: Secondary | ICD-10-CM

## 2020-01-16 NOTE — Telephone Encounter (Signed)
Left voicemail message for patient to call back for her results.

## 2020-01-16 NOTE — Telephone Encounter (Signed)
Spoke with patient and reviewed results of her testing. She verbalized understanding of results with no further questions at this time.

## 2020-01-16 NOTE — Telephone Encounter (Signed)
-----   Message from Kate Sable, MD sent at 01/15/2020  6:14 PM EST ----- Normal systolic function, impaired relaxation.  Mild aortic regurgitation.  No grossly abnormal findings to suggest etiology of shortness of breath.

## 2020-01-19 ENCOUNTER — Telehealth: Payer: Self-pay | Admitting: *Deleted

## 2020-01-19 NOTE — Telephone Encounter (Signed)
Left voicemail message to call back for results.  

## 2020-01-19 NOTE — Telephone Encounter (Signed)
-----   Message from Kate Sable, MD sent at 01/16/2020  4:58 PM EST ----- No atrial fibrillation or atrial flutter noted on this cardiac monitor.  Occasional paroxysmal SVTs not associated with patient triggered events.

## 2020-01-19 NOTE — Telephone Encounter (Signed)
Spoke with patient and reviewed monitor results and confirmed upcoming appointment. She verbalized understanding of our conversation with no further questions at this time.

## 2020-01-22 DIAGNOSIS — Z96652 Presence of left artificial knee joint: Secondary | ICD-10-CM | POA: Diagnosis not present

## 2020-01-26 ENCOUNTER — Institutional Professional Consult (permissible substitution): Payer: Medicare Other | Admitting: Pulmonary Disease

## 2020-01-26 ENCOUNTER — Ambulatory Visit: Payer: Medicare Other | Admitting: Cardiology

## 2020-02-10 ENCOUNTER — Telehealth: Payer: Self-pay | Admitting: Cardiology

## 2020-02-10 ENCOUNTER — Ambulatory Visit: Payer: Medicare Other | Admitting: Cardiology

## 2020-02-10 NOTE — Telephone Encounter (Signed)
Patient declined to reschedule appt stating her heart is fine and fu not needed

## 2020-02-26 DIAGNOSIS — D3121 Benign neoplasm of right retina: Secondary | ICD-10-CM | POA: Diagnosis not present

## 2020-03-19 ENCOUNTER — Other Ambulatory Visit: Payer: Self-pay

## 2020-03-19 ENCOUNTER — Ambulatory Visit (INDEPENDENT_AMBULATORY_CARE_PROVIDER_SITE_OTHER): Payer: Medicare Other | Admitting: Family Medicine

## 2020-03-19 ENCOUNTER — Encounter: Payer: Self-pay | Admitting: Family Medicine

## 2020-03-19 VITALS — BP 116/61 | HR 73 | Ht 61.0 in | Wt 226.8 lb

## 2020-03-19 DIAGNOSIS — M25561 Pain in right knee: Secondary | ICD-10-CM

## 2020-03-19 DIAGNOSIS — F3342 Major depressive disorder, recurrent, in full remission: Secondary | ICD-10-CM | POA: Diagnosis not present

## 2020-03-19 DIAGNOSIS — G8929 Other chronic pain: Secondary | ICD-10-CM

## 2020-03-19 DIAGNOSIS — M1711 Unilateral primary osteoarthritis, right knee: Secondary | ICD-10-CM

## 2020-03-19 DIAGNOSIS — G4733 Obstructive sleep apnea (adult) (pediatric): Secondary | ICD-10-CM

## 2020-03-19 DIAGNOSIS — Z6841 Body Mass Index (BMI) 40.0 and over, adult: Secondary | ICD-10-CM

## 2020-03-19 MED ORDER — WEGOVY 0.25 MG/0.5ML ~~LOC~~ SOAJ: 0.2500 mg | SUBCUTANEOUS | 0 refills | Status: DC

## 2020-03-19 MED ORDER — GABAPENTIN 100 MG PO CAPS: 100.0000 mg | ORAL_CAPSULE | Freq: Two times a day (BID) | ORAL | 1 refills | Status: DC | PRN

## 2020-03-19 MED ORDER — GABAPENTIN 100 MG PO CAPS: 100.0000 mg | ORAL_CAPSULE | Freq: Two times a day (BID) | ORAL | 2 refills | Status: DC | PRN

## 2020-03-19 NOTE — Assessment & Plan Note (Signed)
Known OSA, not on CPAP ?Currently doing well without complications - but she does have daytime fatigue and tired, likely from OSA factor ?Reconsider future PSG - again she declines due to not wanting CPAP machine ?

## 2020-03-19 NOTE — Progress Notes (Signed)
Subjective:    Patient ID: Diana Mcneil, female    DOB: July 11, 1945, 75 y.o.   MRN: [id number]  Diana Mcneil is a 75 y.o. female presenting on 03/19/2020 for Knee Pain (Left knee has been replaced and she feels that the right knee needs to be replaced now too. )   HPI   RIGHT knee pain, chronic / osteoarthritis Chronic Pain / Osteoarthritis multiple joints History - December 2020, s/p Left knee replaced Previously discussed - She needs to get R knee replaced - has chronic pain  - Last visit with me 12/2019, for initial visit for same problem knee pain, treated with Gabapentin trial on medication, see prior notes for background information. - Interval update with Gabapentin 100mg  taking 1-2 caps night time based on knee pain with good results. Also occasionally takes during day PRN with good results, not causing her to be too sedated. Improved on Gabapentin. Helps pain and sleep. She takes 2-4 pills per day with good results.  Chronic low back pain - had imaging. Uses cane for ambulation Worse if rains has severe arthritis pain Previously on Tramadol, has no longer been helpful. She is hesitant about stronger medication. She plans to continue to see Dr Clarise Cruz at [facility] ([place]) and plans to do surgery at [facility] and PT locally here in [place]. Next apt with Orthopedic is next month in December 2021 She is taking Duloxetine for  Mood and pain. She has taken Oxycodone in past from prior surgery. She is not interested in other opiate or stronger med. She asks for other options. Has tried most NSAID therapy in past.  OSA, without CPAP Admits tired and fatigued most of the time as well. "Drained if goes to grocery store"  Morbid Obesity BMI >40 Problem with unable to manage her own weight loss. In past has received medication in past or diet pills or other options. She is interested in assistance with medication.    Depression screen [facility] 2/9 03/19/2020 11/10/2019   Decreased Interest 0 0  Down, Depressed, Hopeless 1 0  PHQ - 2 Score 1 0  Altered sleeping 1 1  Tired, decreased energy 3 3  Change in appetite 0 0  Feeling bad or failure about yourself  0 0  Trouble concentrating 0 0  Moving slowly or fidgety/restless 0 0  Suicidal thoughts 0 0  PHQ-9 Score 5 4  Difficult doing work/chores Somewhat difficult Not difficult at all   GAD 7 : Generalized Anxiety Score 03/19/2020 11/10/2019  Nervous, Anxious, on Edge 1 0  Control/stop worrying 1 0  Worry too much - different things 1 0  Trouble relaxing 0 0  Restless 0 0  Easily annoyed or irritable 0 0  Afraid - awful might happen 0 0  Total GAD 7 Score 3 0  Anxiety Difficulty Somewhat difficult Not difficult at all    Social History   Tobacco Use  . Smoking status: Former Smoker    Packs/day: 0.50    Years: 40.00    Pack years: 20.00    Types: Cigarettes  . Smokeless tobacco: Former Network engineer Use Topics  . Alcohol use: Never  . Drug use: Never    Review of Systems Per HPI unless specifically indicated above     Objective:    BP 116/61   Pulse 73   Ht 5\' 1"  (1.549 m)   Wt 226 lb 12.8 oz (102.9 kg)   SpO2 99%   BMI 42.85 kg/m  Wt Readings from Last 3 Encounters:  03/19/20 226 lb 12.8 oz (102.9 kg)  12/22/19 228 lb 2 oz (103.5 kg)  12/19/19 227 lb (103 kg)    Physical Exam Vitals and nursing note reviewed.  Constitutional:      General: She is not in acute distress.    Appearance: She is well-developed. She is obese. She is not diaphoretic.     Comments: Well-appearing, comfortable, cooperative  HENT:     Head: Normocephalic and atraumatic.  Eyes:     General:        Right eye: No discharge.        Left eye: No discharge.     Conjunctiva/sclera: Conjunctivae normal.  Cardiovascular:     Rate and Rhythm: Normal rate.  Pulmonary:     Effort: Pulmonary effort is normal.  Genitourinary:    Rectum: Guaiac stool:    Musculoskeletal:     Comments: Right Knee  very bulky appearance, effusion, tender to palpation joint line. Uses cane for ambulation. Crepitus on exam ROM.  Skin:    General: Skin is warm and dry.     Findings: No erythema or rash.  Neurological:     Mental Status: She is alert and oriented to person, place, and time.  Psychiatric:        Behavior: Behavior normal.     Comments: Well groomed, good eye contact, normal speech and thoughts        Results for orders placed or performed in visit on 01/15/20  ECHOCARDIOGRAM COMPLETE  Result Value Ref Range   S' Lateral 3.30 cm   Area-P 1/2 2.44 cm2   P 1/2 time 641 msec      Assessment & Plan:   Problem List Items Addressed This Visit    Primary osteoarthritis of right knee    Chronic problem Improved on Gabapentin Will re order, advised can take up to 200mg  BID PRN      Relevant Medications   gabapentin (NEURONTIN) 100 MG capsule   OSA (obstructive sleep apnea)    Known OSA, not on CPAP Currently doing well without complications - but she does have daytime fatigue and tired, likely from OSA factor Reconsider future PSG - again she declines due to not wanting CPAP machine      Morbid obesity with BMI of 40.0-44.9, adult ([place])    Counseling on lifestyle management and now we reviewed med management Offer free sample trial [facility] 0.25mg  weekly x 4 week dosage given today, we can order through insurance if covered or switch to [place] if preferred or consider sample again, short term once complete dose titration      Relevant Medications   WEGOVY 0.25 MG/0.5ML SOAJ   Major depression, recurrent, full remission ([place]) - Primary    Controlled, in remission Continue Duloxetine 20mg  BID      Chronic pain of right knee    See A&P      Relevant Medications   gabapentin (NEURONTIN) 100 MG capsule       Meds ordered this encounter  Medications  . DISCONTD: gabapentin (NEURONTIN) 100 MG capsule    Sig: Take 1-2 capsules (100-200 mg total) by mouth 2 (two) times  daily as needed.    Dispense:  120 capsule    Refill:  2  . gabapentin (NEURONTIN) 100 MG capsule    Sig: Take 1-2 capsules (100-200 mg total) by mouth 2 (two) times daily as needed.    Dispense:  360 capsule  Refill:  1    Please change to 90 day  . WEGOVY 0.25 MG/0.5ML SOAJ    Sig: Inject 0.25 mg into the skin once a week.    Dispense:  2 mL    Refill:  0     Follow up plan: Return in about 4 weeks (around 04/16/2020) for 4-6 weeks Annual Physical (AM for fasting lab AFTER).  AFTER next visit will order at physical CMET, CBC, A1c, Lipid, Thyroid Panel, Vitamin D, Vitamin B12   Nobie Putnam, DO [facility] 03/19/2020, 10:39 AM

## 2020-03-19 NOTE — Assessment & Plan Note (Signed)
See A&P 

## 2020-03-19 NOTE — Patient Instructions (Addendum)
Thank you for coming to the office today.  Call insurance find cost and coverage of the following  Check Saxenda once daily injection or Wegovy injection   DUE for FASTING BLOOD WORK (no food or drink after midnight before the lab appointment, only water or coffee without cream/sugar on the morning of)  SCHEDULE "Lab Only" visit in the morning at the clinic for lab draw in 4-6 WEEKS   - Make sure Lab Only appointment is at about 1 week before your next appointment, so that results will be available  For Lab Results, once available within 2-3 days of blood draw, you can can log in to MyChart online to view your results and a brief explanation. Also, we can discuss results at next follow-up visit.   Please schedule a Follow-up Appointment to: Return in about 4 weeks (around 04/16/2020) for 4-6 weeks Annual Physical (AM for fasting lab AFTER).  If you have any other questions or concerns, please feel free to call the office or send a message through [place]. You may also schedule an earlier appointment if necessary.  Additionally, you may be receiving a survey about your experience at our office within a few days to 1 week by e-mail or mail. We value your feedback.  Nobie Putnam, DO [place]

## 2020-03-19 NOTE — Assessment & Plan Note (Signed)
Controlled, in remission Continue Duloxetine 20mg BID 

## 2020-03-19 NOTE — Assessment & Plan Note (Signed)
Counseling on lifestyle management and now we reviewed med management Offer free sample trial Wegovy 0.25mg  weekly x 4 week dosage given today, we can order through insurance if covered or switch to [place] if preferred or consider sample again, short term once complete dose titration

## 2020-03-19 NOTE — Assessment & Plan Note (Addendum)
>>  ASSESSMENT AND PLAN FOR PRIMARY OSTEOARTHRITIS OF RIGHT KNEE WRITTEN ON 03/19/2020 12:58 PM BY Birdia Jaycox J, DO  Chronic problem Improved on Gabapentin Will re order, advised can take up to 200mg  BID PRN  >>ASSESSMENT AND PLAN FOR CHRONIC PAIN OF RIGHT KNEE WRITTEN ON 03/19/2020 12:58 PM BY Janziel Hockett J, DO  See A&P

## 2020-04-27 ENCOUNTER — Encounter: Payer: Medicare Other | Admitting: Family Medicine

## 2020-05-10 ENCOUNTER — Encounter: Payer: Medicare Other | Admitting: Family Medicine

## 2020-05-25 ENCOUNTER — Other Ambulatory Visit: Payer: Self-pay

## 2020-05-25 ENCOUNTER — Ambulatory Visit (INDEPENDENT_AMBULATORY_CARE_PROVIDER_SITE_OTHER): Payer: Medicare Other | Admitting: Family Medicine

## 2020-05-25 ENCOUNTER — Encounter: Payer: Self-pay | Admitting: Family Medicine

## 2020-05-25 VITALS — BP 126/63 | HR 64 | Temp 97.1°F | Ht 61.0 in | Wt 237.8 lb

## 2020-05-25 DIAGNOSIS — Z6841 Body Mass Index (BMI) 40.0 and over, adult: Secondary | ICD-10-CM

## 2020-05-25 DIAGNOSIS — M8949 Other hypertrophic osteoarthropathy, multiple sites: Secondary | ICD-10-CM | POA: Diagnosis not present

## 2020-05-25 DIAGNOSIS — Z1159 Encounter for screening for other viral diseases: Secondary | ICD-10-CM

## 2020-05-25 DIAGNOSIS — F3342 Major depressive disorder, recurrent, in full remission: Secondary | ICD-10-CM | POA: Diagnosis not present

## 2020-05-25 DIAGNOSIS — Z1231 Encounter for screening mammogram for malignant neoplasm of breast: Secondary | ICD-10-CM | POA: Diagnosis not present

## 2020-05-25 DIAGNOSIS — R7309 Other abnormal glucose: Secondary | ICD-10-CM

## 2020-05-25 DIAGNOSIS — G4733 Obstructive sleep apnea (adult) (pediatric): Secondary | ICD-10-CM

## 2020-05-25 DIAGNOSIS — M159 Polyosteoarthritis, unspecified: Secondary | ICD-10-CM

## 2020-05-25 DIAGNOSIS — Z833 Family history of diabetes mellitus: Secondary | ICD-10-CM

## 2020-05-25 DIAGNOSIS — Z0000 Encounter for general adult medical examination without abnormal findings: Secondary | ICD-10-CM | POA: Diagnosis not present

## 2020-05-25 MED ORDER — NOVOFINE PEN NEEDLE 32G X 6 MM MISC: 3 refills | Status: DC

## 2020-05-25 MED ORDER — SAXENDA 18 MG/3ML ~~LOC~~ SOPN: PEN_INJECTOR | SUBCUTANEOUS | 0 refills | Status: DC

## 2020-05-25 NOTE — Assessment & Plan Note (Signed)
Known OSA, not on CPAP Currently doing well without complications - but she does have daytime fatigue and tired, likely from OSA factor Reconsider future PSG - again she declines due to not wanting CPAP machine

## 2020-05-25 NOTE — Progress Notes (Signed)
Subjective:    Patient ID: Diana Mcneil, female    DOB: 1945/08/29, 75 y.o.   MRN: [id number]  Diana Mcneil is a 75 y.o. female presenting on 05/25/2020 for Annual Exam (Pt would like to know if there would be a better medication for allergies, she states has been having headaches frequently./Pt states the [facility] injection helped while being on it but she notice gain weight again. Pt would like to avoid gaining weight.)   HPI  RIGHT knee pain, chronic / osteoarthritis Chronic Pain / Osteoarthritis multiple joints History -December 2020, s/p Left knee replaced Previously discussed -She needs to get R knee replaced - has chronic pain - Interval update with Gabapentin 100mg  taking 1-2 caps night time based on knee pain with good results. Also occasionally takes during day PRN with good results, not causing her to be too sedated. Improved on Gabapentin. Helps pain and sleep. She takes 2-4 pills per day with good results.  Chronic low back pain - had imaging. Uses cane for ambulation Worse if rains has severe arthritis pain Previously on Tramadol, has no longer been helpful. She is hesitant about stronger medication. She plans to continue to see Dr Clarise Cruz at [facility] ([place]) and plans to do surgery at [facility] and PT locally here in [place]. Followed by Orthopedic. She is takingDuloxetine for Mood and pain. She has taken Oxycodone in past from prior surgery. She is not interested in other opiate or stronger med. She asks for other options. Has tried most NSAID therapy in past.  OSA, without CPAP Admits tired and fatigued most of the time as well  Morbid Obesity BMI >44 Problem with unable to manage her own weight loss. In past has received medication in past or diet pills or other options. She is interested in assistance with medication. Failed Wegovy initial course 0.25mg  x 4 weeks sample, ineffective results. Trial on Saxenda now interested.  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 On Duloxetine 20mg  BID, in remission, no significant concerns.  Migraines Sumatriptan PRN  Chronic Environmental Allergies Off Xyzal On Singulair, Flonase OTC Claritin / Zyrtec Failed most therapy. Has year round medication and symptoms, worse in Spring Previous seen Allergist and had immunotherapy  Health Maintenance:   Last mammogram years ago. Due for Mammogram screening.  Last PNA vaccine Prevnar 13 in 2020.   last colonoscopy 3-4 year ago was told that this would be last one and no repeat recommended. Need record - will fax request to Dr Riley Lam in [place]. She was not planning for any further colonoscopy  Depression screen [facility] 2/9 05/25/2020 03/19/2020 11/10/2019  Decreased Interest 0 0 0  Down, Depressed, Hopeless 0 1 0  PHQ - 2 Score 0 1 0  Altered sleeping 0 1 1  Tired, decreased energy 2 3 3   Change in appetite 0 0 0  Feeling bad or failure about yourself  0 0 0  Trouble concentrating 0 0 0  Moving slowly or fidgety/restless 0 0 0  Suicidal thoughts 0 0 0  PHQ-9 Score 2 5 4   Difficult doing work/chores Somewhat difficult Somewhat difficult Not difficult at all    Past Medical History:  Diagnosis Date  .  Allergy   . Anxiety   . Headache   . Hyperlipidemia   . Migraine   . Sleep apnea    Past Surgical History:  Procedure Laterality Date  . ABDOMINAL HYSTERECTOMY     partial  . BACK SURGERY  1989  . HERNIA REPAIR  09/13/2013  . 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 Marital status: Single    Spouse name: Not on file  . Number of children: Not on file  . Years of education: high  school  . Highest education level: High school graduate  Occupational History  . Not on file  Tobacco Use  . Smoking status: Former Smoker    Packs/day: 0.50    Years: 40.00    Pack years: 20.00    Types: Cigarettes  . Smokeless tobacco: Former Network engineer and Sexual Activity  . Alcohol use: Never  . Drug use: Never  . Sexual activity: Not on file  Other Topics Concern  . Not on file  Social History Narrative  . Not on file   Social Determinants of Health   Financial Resource Strain: Not on file  Food Insecurity: Not on file  Transportation Needs: Not on file  Physical Activity: Not on file  Stress: Not on file  Social Connections: Not on file  Intimate Partner Violence: Not on file   Family History  Problem Relation Age of Onset  . Diabetes Mother   . Heart disease Mother   . Stroke Mother   . CAD Mother   . Diabetes Father   . Diabetes Paternal Grandfather    Current Outpatient Medications on File Prior to Visit  Medication Sig  . alendronate (FOSAMAX) 70 MG tablet Take 1 tablet (70 mg total) by mouth once a week. Take with a full glass of water on an empty stomach.  Marland Kitchen ascorbic acid (VITAMIN C) 500 MG tablet Take by mouth.  Marland Kitchen aspirin 81 MG EC tablet Take by mouth.  . Cholecalciferol 25 MCG (1000 UT) tablet Take by mouth.  . DULoxetine (CYMBALTA) 20 MG capsule Take 20 mg by mouth in the morning and at bedtime.  . fluticasone (FLONASE) 50 MCG/ACT nasal spray Place 2 sprays into both nostrils daily.  Marland Kitchen gabapentin (NEURONTIN) 100 MG capsule Take 1-2 capsules (100-200 mg total) by mouth 2 (two) times daily as needed.  . montelukast (SINGULAIR) 10 MG tablet Take 10 mg by mouth daily.  . pantoprazole (PROTONIX) 40 MG tablet Take 40 mg by mouth daily.  . rosuvastatin (CRESTOR) 20 MG tablet Take 20 mg by mouth daily.   . SUMAtriptan (IMITREX) 100 MG tablet Take 100 mg by mouth in the morning and at bedtime.    No current facility-administered medications on file  prior to visit.    Review of Systems  Constitutional: Negative for activity change, appetite change, chills, diaphoresis, fatigue and fever.  HENT: Negative for congestion and hearing loss.   Eyes: Negative for visual disturbance.  Respiratory: Negative for cough, chest tightness, shortness of breath and wheezing.   Cardiovascular: Negative for chest pain, palpitations and leg swelling.  Gastrointestinal: Negative for abdominal pain, constipation, diarrhea, nausea and vomiting.  Endocrine: Negative for cold intolerance.  Genitourinary: Negative for dysuria, frequency and hematuria.  Musculoskeletal: Negative for arthralgias and neck pain.  Skin: Negative for rash.  Allergic/Immunologic: Negative for environmental allergies.  Neurological: Negative for dizziness, weakness, light-headedness, numbness and headaches.  Hematological: Negative for adenopathy.  Psychiatric/Behavioral: Negative  for behavioral problems, dysphoric mood and sleep disturbance.   Per HPI unless specifically indicated above      Objective:    BP 126/63 (BP Location: Left Arm, Patient Position: Sitting, Cuff Size: Large)   Pulse 64   Temp (!) 97.1 F (36.2 C) (Temporal)   Ht 5\' 1"  (1.549 m)   Wt 237 lb 12.8 oz (107.9 kg)   SpO2 99%   BMI 44.93 kg/m   Wt Readings from Last 3 Encounters:  05/25/20 237 lb 12.8 oz (107.9 kg)  03/19/20 226 lb 12.8 oz (102.9 kg)  12/22/19 228 lb 2 oz (103.5 kg)    Physical Exam Vitals and nursing note reviewed.  Constitutional:      General: She is not in acute distress.    Appearance: She is well-developed. She is obese. She is not diaphoretic.     Comments: Well-appearing, comfortable, cooperative  HENT:     Head: Normocephalic and atraumatic.  Eyes:     General:        Right eye: No discharge.        Left eye: No discharge.     Conjunctiva/sclera: Conjunctivae normal.     Pupils: Pupils are equal, round, and reactive to light.  Neck:     Thyroid: No thyromegaly.   Cardiovascular:     Rate and Rhythm: Normal rate and regular rhythm.     Heart sounds: Normal heart sounds. No murmur heard.   Pulmonary:     Effort: Pulmonary effort is normal. No respiratory distress.     Breath sounds: Normal breath sounds. No wheezing or rales.  Abdominal:     General: Bowel sounds are normal. There is no distension.     Palpations: Abdomen is soft. There is no mass.     Tenderness: There is no abdominal tenderness.  Musculoskeletal:        General: No tenderness. Normal range of motion.     Cervical back: Normal range of motion and neck supple.     Right lower leg: Edema present.     Left lower leg: Edema present.     Comments: Right Knee very bulky appearance, effusion, tender to palpation joint line. Uses cane for ambulation. Crepitus on exam ROM  Lymphadenopathy:     Cervical: No cervical adenopathy.  Skin:    General: Skin is warm and dry.     Findings: No erythema or rash.  Neurological:     Mental Status: She is alert and oriented to person, place, and time.     Comments: Distal sensation intact to light touch all extremities  Psychiatric:        Behavior: Behavior normal.     Comments: Well groomed, good eye contact, normal speech and thoughts       Results for orders placed or performed in visit on 01/15/20  ECHOCARDIOGRAM COMPLETE  Result Value Ref Range   S' Lateral 3.30 cm   Area-P 1/2 2.44 cm2   P 1/2 time 641 msec      Assessment & Plan:   Problem List Items Addressed This Visit    Primary osteoarthritis involving multiple joints    Chronic problem, multiple joints Chronic pain underlying Knees, back primarily S/p L knee replacement future R knee likely need surgery Advised safe Tylenol dosing PRN On Duloxetine, gabapentin      OSA (obstructive sleep apnea)    Known OSA, not on CPAP Currently doing well without complications - but she does have daytime fatigue and tired,  likely from OSA factor Reconsider future PSG - again  she declines due to not wanting CPAP machine      Morbid obesity with BMI of 40.0-44.9, adult (HCC)    Abnormal weight Prior failed Wegovy 4 week trial low dose 0.25mg  ineffective Trial on Saxenda now new rx sent - GLP1 daily, pen needles ordered, dose titration as prescribed, will need prior auth      Relevant Medications   SAXENDA 18 MG/3ML SOPN   Insulin Pen Needle (NOVOFINE PEN NEEDLE) 32G X 6 MM MISC   Other Relevant Orders   CBC with Differential/Platelet   COMPLETE METABOLIC PANEL WITH GFR   Lipid panel   TSH   Major depression, recurrent, full remission (HCC)    Controlled, in remission Continue Duloxetine 20mg  BID      Relevant Orders   TSH    Other Visit Diagnoses    Annual physical exam    -  Primary   Relevant Orders   Hemoglobin A1c   CBC with Differential/Platelet   COMPLETE METABOLIC PANEL WITH GFR   Lipid panel   Encounter for screening mammogram for malignant neoplasm of breast       Relevant Orders   MM 3D SCREEN BREAST BILATERAL   Need for hepatitis C screening test       Relevant Orders   Hepatitis C antibody   Abnormal glucose       Relevant Orders   Hemoglobin A1c   Family history of diabetes mellitus       Relevant Orders   Hemoglobin A1c      Updated Health Maintenance information Mammogram ordered. Labs ordered, to be drawn. F/u results Encouraged improvement to lifestyle with diet and exercise Goal of weight loss   ____________________________________________________ Additional Rx Information (May be used for Prior Authorization if required)  Medication name and Strength: Saxenda 3mg   1. Primary Diagnosis and ICD10 Code: Morbid Obesity BMI >40-44.9 (E66.01, Z68.41) 2. Secondary Diagnosis and ICD10 Code: n/a  3. Previous Failed Medications (Duration or Start Date) a. Wegovy (03/2020-05/2020) 4. Quantity and Duration of New Medication: 68mL for 6 weeks starting dose titration, then 15 mL for 4 weeks. 5. Additional Supporting  Information: ___________________________________________   Meds ordered this encounter  Medications  . SAXENDA 18 MG/3ML SOPN    Sig: Injection 0.6 mg into skin once daily for 1 week, as tolerated increase by increment of 0.6mg  every 1 week, max dose is 3mg  injection daily after 5 weeks.    Dispense:  15 mL    Refill:  0  . Insulin Pen Needle (NOVOFINE PEN NEEDLE) 32G X 6 MM MISC    Sig: Use with Saxenda injection daily.    Dispense:  90 each    Refill:  3      Follow up plan: Return in about 3 months (around 08/24/2020) for 3 month follow-up Obesity wt management.  Nobie Putnam, [facility] 05/25/2020, 9:13 AM

## 2020-05-25 NOTE — Assessment & Plan Note (Signed)
Chronic problem, multiple joints Chronic pain underlying Knees, back primarily S/p L knee replacement future R knee likely need surgery Advised safe Tylenol dosing PRN On Duloxetine, gabapentin

## 2020-05-25 NOTE — Patient Instructions (Addendum)
Thank you for coming to the office today.  Saxenda injection, DAILY injection, use new pen needle each time, first pen can last up to 6 weeks, then it would be 4 weeks per pen. Will try to get approved from pharmacy.  Contact if you need refills  Please schedule a Follow-up Appointment to: Return in about 3 months (around 08/24/2020) for 3 month follow-up Obesity wt management.  If you have any other questions or concerns, please feel free to call the office or send a message through [place]. You may also schedule an earlier appointment if necessary.  Additionally, you may be receiving a survey about your experience at our office within a few days to 1 week by e-mail or mail. We value your feedback.  Nobie Putnam, DO [place]

## 2020-05-25 NOTE — Assessment & Plan Note (Signed)
Controlled, in remission Continue Duloxetine 20mg  BID

## 2020-05-25 NOTE — Assessment & Plan Note (Signed)
Abnormal weight Prior failed Wegovy 4 week trial low dose 0.25mg  ineffective Trial on Saxenda now new rx sent - GLP1 daily, pen needles ordered, dose titration as prescribed, will need prior auth

## 2020-05-26 LAB — COMPLETE METABOLIC PANEL WITH GFR
AG Ratio: 1.9 (calc) (ref 1.0–2.5)
ALT: 23 U/L (ref 6–29)
AST: 23 U/L (ref 10–35)
Albumin: 4.2 g/dL (ref 3.6–5.1)
Alkaline phosphatase (APISO): 85 U/L (ref 37–153)
BUN: 19 mg/dL (ref 7–25)
CO2: 29 mmol/L (ref 20–32)
Calcium: 8.9 mg/dL (ref 8.6–10.4)
Chloride: 105 mmol/L (ref 98–110)
Creat: 0.76 mg/dL (ref 0.60–0.93)
GFR, Est African American: 90 mL/min/{1.73_m2} (ref >=60)
GFR, Est Non African American: 77 mL/min/{1.73_m2} (ref >=60)
Globulin: 2.2 g/dL (calc) (ref 1.9–3.7)
Glucose, Bld: 87 mg/dL (ref 65–99)
Potassium: 3.7 mmol/L (ref 3.5–5.3)
Sodium: 142 mmol/L (ref 135–146)
Total Bilirubin: 0.7 mg/dL (ref 0.2–1.2)
Total Protein: 6.4 g/dL (ref 6.1–8.1)

## 2020-05-26 LAB — CBC WITH DIFFERENTIAL/PLATELET
Absolute Monocytes: 479 cells/uL (ref 200–950)
Basophils Absolute: 39 cells/uL (ref 0–200)
Basophils Relative: 0.7 %
Eosinophils Absolute: 83 cells/uL (ref 15–500)
Eosinophils Relative: 1.5 %
HCT: 42.3 % (ref 35.0–45.0)
Hemoglobin: 14 g/dL (ref 11.7–15.5)
Lymphs Abs: 1496 cells/uL (ref 850–3900)
MCH: 31.3 pg (ref 27.0–33.0)
MCHC: 33.1 g/dL (ref 32.0–36.0)
MCV: 94.4 fL (ref 80.0–100.0)
MPV: 11.2 fL (ref 7.5–12.5)
Monocytes Relative: 8.7 %
Neutro Abs: 3405 cells/uL (ref 1500–7800)
Neutrophils Relative %: 61.9 %
Platelets: 167 10*3/uL (ref 140–400)
RBC: 4.48 10*6/uL (ref 3.80–5.10)
RDW: 12.3 % (ref 11.0–15.0)
Total Lymphocyte: 27.2 %
WBC: 5.5 10*3/uL (ref 3.8–10.8)

## 2020-05-26 LAB — LIPID PANEL
Cholesterol: 173 mg/dL (ref <200)
HDL: 72 mg/dL (ref >=50)
LDL Cholesterol (Calc): 86 mg/dL (calc)
Non-HDL Cholesterol (Calc): 101 mg/dL (calc) (ref <130)
Total CHOL/HDL Ratio: 2.4 (calc) (ref <5.0)
Triglycerides: 66 mg/dL (ref <150)

## 2020-05-26 LAB — TSH: TSH: 2.25 mIU/L (ref 0.40–4.50)

## 2020-05-26 LAB — HEPATITIS C ANTIBODY
Hepatitis C Ab: NONREACTIVE
SIGNAL TO CUT-OFF: 0.01 (ref <1.00)

## 2020-05-26 LAB — HEMOGLOBIN A1C
Hgb A1c MFr Bld: 5.1 % of total Hgb (ref <5.7)
Mean Plasma Glucose: 100 mg/dL
eAG (mmol/L): 5.5 mmol/L

## 2020-05-31 ENCOUNTER — Telehealth: Payer: Self-pay

## 2020-05-31 NOTE — Telephone Encounter (Signed)
She has failed Wegovy in past, so we switched to [place].  Information is on the chart for Prior Auth.  I am not completely sure that the Prior Auth was approved or denied.  Could you follow-up with the patient to determine the following?  1. Was the PA approved?  2. Does she have high deductible / donut hole / reason for high cost?  3. Or is it a high tier list medication / not on formulary?  If they told her to do formulary exception - then she will need to send [place] the request for this or have the pharmacy send [place] information on how to obtain this form and complete it.  Nobie Putnam, [facility] 05/31/2020, 5:10 PM

## 2020-05-31 NOTE — Telephone Encounter (Signed)
Copied from [place] [id number]. Topic: General - Inquiry >> May 31, 2020 12:41 PM Greggory Keen D wrote: Reason for CRM: Pt called saying the pharmacy has her Saxenda at the pharmacy but she said it is over 1000.00 and he can not afford this.  The insurance told he to call her doctor and ask for a form.  Formulary exception form.  CB#  [phone]

## 2020-06-01 ENCOUNTER — Telehealth: Payer: Self-pay

## 2020-06-01 NOTE — Telephone Encounter (Signed)
Completed PA on [email or web address]. Documented in separate telephone call. See chart. Awaiting results to be faxed to the office.

## 2020-06-01 NOTE — Telephone Encounter (Signed)
Completed PA on [email or web address] for Saxenda 18mg /3ML.   (Key: [id number])  Awaiting outcome from insurance- should be faxed to the office for approval or denial.

## 2020-06-07 NOTE — Telephone Encounter (Signed)
PA Denied per insurance. Not covered by plan.

## 2020-06-21 ENCOUNTER — Ambulatory Visit
Admission: RE | Admit: 2020-06-21 | Discharge: 2020-06-21 | Disposition: A | Discharge status: Home / Self Care | Payer: Medicare Other | Source: Ambulatory Visit | Attending: Family Medicine | Admitting: Family Medicine

## 2020-06-21 ENCOUNTER — Other Ambulatory Visit: Payer: Self-pay

## 2020-06-21 DIAGNOSIS — Z1231 Encounter for screening mammogram for malignant neoplasm of breast: Secondary | ICD-10-CM | POA: Diagnosis not present

## 2020-06-22 ENCOUNTER — Other Ambulatory Visit: Payer: Self-pay | Admitting: Family Medicine

## 2020-06-22 DIAGNOSIS — R928 Other abnormal and inconclusive findings on diagnostic imaging of breast: Secondary | ICD-10-CM

## 2020-06-22 DIAGNOSIS — N6489 Other specified disorders of breast: Secondary | ICD-10-CM

## 2020-06-25 ENCOUNTER — Ambulatory Visit: Admission: RE | Admit: 2020-06-25 | Payer: Medicare Other | Source: Ambulatory Visit

## 2020-06-25 ENCOUNTER — Other Ambulatory Visit: Payer: Medicare Other

## 2020-07-02 ENCOUNTER — Inpatient Hospital Stay: Admission: RE | Admit: 2020-07-02 | Payer: Medicare Other | Source: Ambulatory Visit

## 2020-07-02 ENCOUNTER — Other Ambulatory Visit: Payer: Medicare Other

## 2020-07-07 ENCOUNTER — Inpatient Hospital Stay: Admission: RE | Admit: 2020-07-07 | Payer: Medicare Other | Source: Ambulatory Visit

## 2020-07-07 ENCOUNTER — Other Ambulatory Visit: Payer: Medicare Other

## 2020-07-16 ENCOUNTER — Ambulatory Visit
Admission: RE | Admit: 2020-07-16 | Discharge: 2020-07-16 | Disposition: A | Discharge status: Home / Self Care | Payer: Medicare Other | Source: Ambulatory Visit | Attending: Family Medicine | Admitting: Family Medicine

## 2020-07-16 ENCOUNTER — Other Ambulatory Visit: Payer: Self-pay

## 2020-07-16 DIAGNOSIS — R922 Inconclusive mammogram: Secondary | ICD-10-CM | POA: Diagnosis not present

## 2020-07-16 DIAGNOSIS — N6489 Other specified disorders of breast: Secondary | ICD-10-CM | POA: Insufficient documentation

## 2020-07-16 DIAGNOSIS — R928 Other abnormal and inconclusive findings on diagnostic imaging of breast: Secondary | ICD-10-CM

## 2020-07-19 ENCOUNTER — Other Ambulatory Visit: Payer: Self-pay | Admitting: Family Medicine

## 2020-07-19 DIAGNOSIS — R928 Other abnormal and inconclusive findings on diagnostic imaging of breast: Secondary | ICD-10-CM

## 2020-07-19 DIAGNOSIS — N6489 Other specified disorders of breast: Secondary | ICD-10-CM

## 2020-07-20 ENCOUNTER — Ambulatory Visit (INDEPENDENT_AMBULATORY_CARE_PROVIDER_SITE_OTHER): Payer: Medicare Other

## 2020-07-20 VITALS — Ht 61.0 in | Wt 230.0 lb

## 2020-07-20 DIAGNOSIS — Z0000 Encounter for general adult medical examination without abnormal findings: Secondary | ICD-10-CM | POA: Diagnosis not present

## 2020-07-20 NOTE — Progress Notes (Signed)
I connected with [name] today by telephone and verified that I am speaking with the correct person using two identifiers. Location patient: home Location provider: work Persons participating in the virtual visit: Sherice, Ijames LPN.   I discussed the limitations, risks, security and privacy concerns of performing an evaluation and management service by telephone and the availability of in person appointments. I also discussed with the patient that there may be a patient responsible charge related to this service. The patient expressed understanding and verbally consented to this telephonic visit.    Interactive audio and video telecommunications were attempted between this provider and patient, however failed, due to patient having technical difficulties OR patient did not have access to video capability.  We continued and completed visit with audio only.     Vital signs may be patient reported or missing.  Subjective:   Diana Mcneil is a 75 y.o. female who presents for an Initial Medicare Annual Wellness Visit.  Review of Systems     Cardiac Risk Factors include: advanced age (>44men, >81 women);obesity (BMI >30kg/m2);sedentary lifestyle     Objective:    Today's Vitals   07/20/20 1316 07/20/20 1317  Weight: 230 lb (104.3 kg)   Height: 5\' 1"  (1.549 m)   PainSc:  5    Body mass index is 43.46 kg/m.  Advanced Directives 07/20/2020  Does Patient Have a Medical Advance Directive? Yes  Type of Paramedic of [place];Living will  Copy of [place] in Chart? No - copy requested    Current Medications (verified) Outpatient Encounter Medications as of 07/20/2020  Medication Sig   alendronate (FOSAMAX) 70 MG tablet Take 1 tablet (70 mg total) by mouth once a week. Take with a full glass of water on an empty stomach.   ascorbic acid (VITAMIN C) 500 MG tablet Take by mouth.   aspirin 81 MG EC tablet Take by mouth.    Cholecalciferol 25 MCG (1000 UT) tablet Take by mouth.   DULoxetine (CYMBALTA) 20 MG capsule Take 20 mg by mouth in the morning and at bedtime.   fluticasone (FLONASE) 50 MCG/ACT nasal spray Place 2 sprays into both nostrils daily.   gabapentin (NEURONTIN) 100 MG capsule Take 1-2 capsules (100-200 mg total) by mouth 2 (two) times daily as needed.   montelukast (SINGULAIR) 10 MG tablet Take 10 mg by mouth daily.   pantoprazole (PROTONIX) 40 MG tablet Take 40 mg by mouth daily.   rosuvastatin (CRESTOR) 20 MG tablet Take 20 mg by mouth daily.    SUMAtriptan (IMITREX) 100 MG tablet Take 100 mg by mouth in the morning and at bedtime.    Insulin Pen Needle (NOVOFINE PEN NEEDLE) 32G X 6 MM MISC Use with Saxenda injection daily. (Patient not taking: Reported on 07/20/2020)   SAXENDA 18 MG/3ML SOPN Injection 0.6 mg into skin once daily for 1 week, as tolerated increase by increment of 0.6mg  every 1 week, max dose is 3mg  injection daily after 5 weeks. (Patient not taking: Reported on 07/20/2020)   No facility-administered encounter medications on file as of 07/20/2020.    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   Family History  Problem Relation Age of Onset   Diabetes Mother    Heart disease Mother    Stroke Mother    CAD Mother    Diabetes Father    Diabetes Paternal Grandfather    Social History   Socioeconomic History   Marital status: Single    Spouse name: Not on file   Number of children: Not on file   Years of education: high school   Highest education level: High school graduate  Occupational History   Not on file  Tobacco Use    Smoking status: Former    Packs/day: 0.50    Years: 40.00    Pack years: 20.00    Types: Cigarettes   Smokeless tobacco: Former  Scientific laboratory technician Use: Never used  Substance and Sexual Activity   Alcohol use: Never   Drug use: Never   Sexual activity: Not on file  Other Topics Concern   Not on file  Social History Narrative   Not on file   Social Determinants of Health   Financial Resource Strain: Low Risk    Difficulty of Paying Living Expenses: Not hard at all  Food Insecurity: No Food Insecurity   Worried About Charity fundraiser in the Last Year: Never true   Arboriculturist in the Last Year: Never true  Transportation Needs: No Transportation Needs   Lack of Transportation (Medical): No   Lack of Transportation (Non-Medical): No  Physical Activity: Inactive   Days of Exercise per Week: 0 days   Minutes of Exercise per Session: 0 min  Stress: Stress Concern Present   Feeling of Stress : To some extent  Social Connections: Not on file    Tobacco Counseling Counseling given: Not Answered   Clinical Intake:  Pre-visit preparation completed: Yes  Pain : 0-10 Pain Score: 5  Pain Type: Chronic pain Pain Location: Back Pain Orientation: Lower Pain Descriptors / Indicators: Aching Pain Onset: More than a month ago Pain Frequency: Constant Pain Relieving Factors: gabapentin  Pain Relieving Factors: gabapentin  Nutritional Status: BMI > 30  Obese Nutritional Risks: None Diabetes: No  How often do you need to have someone help you when you read instructions, pamphlets, or other written materials from your doctor or pharmacy?: 1 - Never What is the last grade level you completed in school?: 12th grade  Diabetic? no  Interpreter Needed?: No  Information entered by :: NAllen LPN   Activities of Daily Living In your present state of health, do you have any difficulty performing the following activities: 07/20/2020 11/10/2019  Hearing? N N  Comment at  times -  Vision? N N  Difficulty concentrating or making decisions? N N  Walking or climbing stairs? Y Y  Dressing or bathing? N N  Doing errands, shopping? N N  Preparing Food and eating ? N -  Using the Toilet? N -  In the past six months, have you accidently leaked urine? N -  Do you have problems with loss of bowel control? N -  Managing your Medications? N -  Managing your Finances? N -  Housekeeping or managing your Housekeeping? N -    Patient Care Team: Olin Hauser, DO as PCP - General (Family Medicine) Kate Sable, MD as PCP - Cardiology (Cardiology)  Indicate any recent Medical Services you may have received from other than  [facility] providers in the past year (date may be approximate).     Assessment:   This is a routine wellness examination for [place].  Hearing/Vision screen No results found.  Dietary issues and exercise activities discussed: Current Exercise Habits: The patient does not participate in regular exercise at present   Goals Addressed             This Visit's Progress    Patient Stated       07/20/2020, wants to weigh 125-130 pounds        Depression Screen PHQ 2/9 Scores 07/20/2020 05/25/2020 03/19/2020 11/10/2019  PHQ - 2 Score 0 0 1 0  PHQ- 9 Score - 2 5 4     Fall Risk Fall Risk  07/20/2020 03/19/2020 11/10/2019  Falls in the past year? 0 0 0  Number falls in past yr: - 0 0  Injury with Fall? - 0 0  Risk for fall due to : Medication side effect - -  Follow up Falls evaluation completed;Education provided;Falls prevention discussed - Falls evaluation completed    FALL RISK PREVENTION PERTAINING TO THE HOME:  Any stairs in or around the home? No  If so, are there any without handrails? N/a Home free of loose throw rugs in walkways, pet beds, electrical cords, etc? Yes  Adequate lighting in your home to reduce risk of falls? Yes   ASSISTIVE DEVICES UTILIZED TO PREVENT FALLS:  Life alert? No  Use of a cane, walker or  w/c? Yes  Grab bars in the bathroom? Yes  Shower chair or bench in shower? No  Elevated toilet seat or a handicapped toilet? Yes   TIMED UP AND GO:  Was the test performed? No .      Cognitive Function:     6CIT Screen 07/20/2020  What Year? 0 points  What month? 0 points  What time? 0 points  Count back from 20 0 points  Months in reverse 0 points  Repeat phrase 4 points  Total Score 4    Immunizations Immunization History  Administered Date(s) Administered   Fluad Quad(high Dose 65+) 11/10/2019   Influenza Split 03/13/2016   Influenza, High Dose Seasonal PF 12/18/2016   Pneumococcal Conjugate-13 08/07/2018   Pneumococcal Polysaccharide-23 12/18/2016    TDAP status: Due, Education has been provided regarding the importance of this vaccine. Advised may receive this vaccine at local pharmacy or Health Dept. Aware to provide a copy of the vaccination record if obtained from local pharmacy or Health Dept. Verbalized acceptance and understanding.  Flu Vaccine status: Up to date  Pneumococcal vaccine status: Up to date  Covid-19 vaccine status: Declined, Education has been provided regarding the importance of this vaccine but patient still declined. Advised may receive this vaccine at local pharmacy or Health Dept.or vaccine clinic. Aware to provide a copy of the vaccination record if obtained from local pharmacy or Health Dept. Verbalized acceptance and understanding.  Qualifies for Shingles Vaccine? Yes   Zostavax completed No   Shingrix Completed?: needs second dose  Screening Tests Health Maintenance  Topic Date Due   COVID-19 Vaccine (1) Never done   Zoster Vaccines- Shingrix (1 of 2) Never done   COLONOSCOPY (Pts 45-81yrs Insurance coverage will need to be confirmed)  Never done   DEXA SCAN  Never done   TETANUS/TDAP  05/25/2021 (Originally 09/03/1964)   INFLUENZA VACCINE  09/06/2020   MAMMOGRAM  06/22/2022   Hepatitis C Screening  Completed   PNA vac Low Risk  Adult  Completed   HPV VACCINES  Aged Out    Health Maintenance  Health Maintenance Due  Topic Date Due   COVID-19 Vaccine (1) Never done   Zoster Vaccines- Shingrix (1 of 2) Never done   COLONOSCOPY (Pts 45-15yrs Insurance coverage will need to be confirmed)  Never done   DEXA SCAN  Never done    Colorectal cancer screening: No longer required.   Mammogram status: Completed 07/16/2020. Repeat every year  Bone Density status: Ordered 11/10/2019. Pt provided with contact info and advised to call to schedule appt.  Lung Cancer Screening: (Low Dose CT Chest recommended if Age 39-80 years, 30 pack-year currently smoking OR have quit w/in 15years.) does not qualify.   Lung Cancer Screening Referral: no  Additional Screening:  Hepatitis C Screening: does qualify; Completed 05/25/2020  Vision Screening: Recommended annual ophthalmology exams for early detection of glaucoma and other disorders of the eye. Is the patient up to date with their annual eye exam?  Yes  Who is the provider or what is the name of the office in which the patient attends annual eye exams? Can't remember name If pt is not established with a provider, would they like to be referred to a provider to establish care? No .   Dental Screening: Recommended annual dental exams for proper oral hygiene  Community Resource Referral / Chronic Care Management: CRR required this visit?  No   CCM required this visit?  No      Plan:     I have personally reviewed and noted the following in the patient's chart:   Medical and social history Use of alcohol, tobacco or illicit drugs  Current medications and supplements including opioid prescriptions. Patient is not currently taking opioid prescriptions. Functional ability and status Nutritional status Physical activity Advanced directives List of other physicians Hospitalizations, surgeries, and ER visits in previous 12 months Vitals Screenings to include cognitive,  depression, and falls Referrals and appointments  In addition, I have reviewed and discussed with patient certain preventive protocols, quality metrics, and best practice recommendations. A written personalized care plan for preventive services as well as general preventive health recommendations were provided to patient.     Kellie Simmering, LPN   [id number]   Nurse Notes:

## 2020-07-20 NOTE — Patient Instructions (Signed)
Ms. Ridlon , Thank you for taking time to come for your Medicare Wellness Visit. I appreciate your ongoing commitment to your health goals. Please review the following plan we discussed and let me know if I can assist you in the future.   Screening recommendations/referrals: Colonoscopy: not required Mammogram: completed 07/16/2020 Bone Density: due Recommended yearly ophthalmology/optometry visit for glaucoma screening and checkup Recommended yearly dental visit for hygiene and checkup  Vaccinations: Influenza vaccine: completed 11/10/2019, due 09/06/2020 Pneumococcal vaccine: completed 08/07/2018 Tdap vaccine: due Shingles vaccine: needs second dose   Covid-19:decline  Advanced directives: Please bring a copy of your POA (Power of Attorney) and/or Living Will to your next appointment.   Conditions/risks identified: none  Next appointment: Follow up in one year for your annual wellness visit    Preventive Care 65 Years and Older, Female Preventive care refers to lifestyle choices and visits with your health care provider that can promote health and wellness. What does preventive care include? A yearly physical exam. This is also called an annual well check. Dental exams once or twice a year. Routine eye exams. Ask your health care provider how often you should have your eyes checked. Personal lifestyle choices, including: Daily care of your teeth and gums. Regular physical activity. Eating a healthy diet. Avoiding tobacco and drug use. Limiting alcohol use. Practicing safe sex. Taking low-dose aspirin every day. Taking vitamin and mineral supplements as recommended by your health care provider. What happens during an annual well check? The services and screenings done by your health care provider during your annual well check will depend on your age, overall health, lifestyle risk factors, and family history of disease. Counseling  Your health care provider may ask you questions  about your: Alcohol use. Tobacco use. Drug use. Emotional well-being. Home and relationship well-being. Sexual activity. Eating habits. History of falls. Memory and ability to understand (cognition). Work and work Statistician. Reproductive health. Screening  You may have the following tests or measurements: Height, weight, and BMI. Blood pressure. Lipid and cholesterol levels. These may be checked every 5 years, or more frequently if you are over 82 years old. Skin check. Lung cancer screening. You may have this screening every year starting at age 51 if you have a 30-pack-year history of smoking and currently smoke or have quit within the past 15 years. Fecal occult blood test (FOBT) of the stool. You may have this test every year starting at age 86. Flexible sigmoidoscopy or colonoscopy. You may have a sigmoidoscopy every 5 years or a colonoscopy every 10 years starting at age 83. Hepatitis C blood test. Hepatitis B blood test. Sexually transmitted disease (STD) testing. Diabetes screening. This is done by checking your blood sugar (glucose) after you have not eaten for a while (fasting). You may have this done every 1-3 years. Bone density scan. This is done to screen for osteoporosis. You may have this done starting at age 89. Mammogram. This may be done every 1-2 years. Talk to your health care provider about how often you should have regular mammograms. Talk with your health care provider about your test results, treatment options, and if necessary, the need for more tests. Vaccines  Your health care provider may recommend certain vaccines, such as: Influenza vaccine. This is recommended every year. Tetanus, diphtheria, and acellular pertussis (Tdap, Td) vaccine. You may need a Td booster every 10 years. Zoster vaccine. You may need this after age 59. Pneumococcal 13-valent conjugate (PCV13) vaccine. One dose is recommended after  age 60. Pneumococcal polysaccharide (PPSV23)  vaccine. One dose is recommended after age 27. Talk to your health care provider about which screenings and vaccines you need and how often you need them. This information is not intended to replace advice given to you by your health care provider. Make sure you discuss any questions you have with your health care provider. Document Released: 02/19/2015 Document Revised: 10/13/2015 Document Reviewed: 11/24/2014 Elsevier Interactive Patient Education  2017 [place] Prevention in the Home Falls can cause injuries. They can happen to people of all ages. There are many things you can do to make your home safe and to help prevent falls. What can I do on the outside of my home? Regularly fix the edges of walkways and driveways and fix any cracks. Remove anything that might make you trip as you walk through a door, such as a raised step or threshold. Trim any bushes or trees on the path to your home. Use bright outdoor lighting. Clear any walking paths of anything that might make someone trip, such as rocks or tools. Regularly check to see if handrails are loose or broken. Make sure that both sides of any steps have handrails. Any raised decks and porches should have guardrails on the edges. Have any leaves, snow, or ice cleared regularly. Use sand or salt on walking paths during winter. Clean up any spills in your garage right away. This includes oil or grease spills. What can I do in the bathroom? Use night lights. Install grab bars by the toilet and in the tub and shower. Do not use towel bars as grab bars. Use non-skid mats or decals in the tub or shower. If you need to sit down in the shower, use a plastic, non-slip stool. Keep the floor dry. Clean up any water that spills on the floor as soon as it happens. Remove soap buildup in the tub or shower regularly. Attach bath mats securely with double-sided non-slip rug tape. Do not have throw rugs and other things on the floor that  can make you trip. What can I do in the bedroom? Use night lights. Make sure that you have a light by your bed that is easy to reach. Do not use any sheets or blankets that are too big for your bed. They should not hang down onto the floor. Have a firm chair that has side arms. You can use this for support while you get dressed. Do not have throw rugs and other things on the floor that can make you trip. What can I do in the kitchen? Clean up any spills right away. Avoid walking on wet floors. Keep items that you use a lot in easy-to-reach places. If you need to reach something above you, use a strong step stool that has a grab bar. Keep electrical cords out of the way. Do not use floor polish or wax that makes floors slippery. If you must use wax, use non-skid floor wax. Do not have throw rugs and other things on the floor that can make you trip. What can I do with my stairs? Do not leave any items on the stairs. Make sure that there are handrails on both sides of the stairs and use them. Fix handrails that are broken or loose. Make sure that handrails are as long as the stairways. Check any carpeting to make sure that it is firmly attached to the stairs. Fix any carpet that is loose or worn. Avoid having throw rugs  at the top or bottom of the stairs. If you do have throw rugs, attach them to the floor with carpet tape. Make sure that you have a light switch at the top of the stairs and the bottom of the stairs. If you do not have them, ask someone to add them for you. What else can I do to help prevent falls? Wear shoes that: Do not have high heels. Have rubber bottoms. Are comfortable and fit you well. Are closed at the toe. Do not wear sandals. If you use a stepladder: Make sure that it is fully opened. Do not climb a closed stepladder. Make sure that both sides of the stepladder are locked into place. Ask someone to hold it for you, if possible. Clearly mark and make sure that you  can see: Any grab bars or handrails. First and last steps. Where the edge of each step is. Use tools that help you move around (mobility aids) if they are needed. These include: Canes. Walkers. Scooters. Crutches. Turn on the lights when you go into a dark area. Replace any light bulbs as soon as they burn out. Set up your furniture so you have a clear path. Avoid moving your furniture around. If any of your floors are uneven, fix them. If there are any pets around you, be aware of where they are. Review your medicines with your doctor. Some medicines can make you feel dizzy. This can increase your chance of falling. Ask your doctor what other things that you can do to help prevent falls. This information is not intended to replace advice given to you by your health care provider. Make sure you discuss any questions you have with your health care provider. Document Released: 11/19/2008 Document Revised: 07/01/2015 Document Reviewed: 02/27/2014 Elsevier Interactive Patient Education  2017 [employer].

## 2020-08-19 ENCOUNTER — Ambulatory Visit
Admission: RE | Admit: 2020-08-19 | Discharge: 2020-08-19 | Disposition: A | Discharge status: Home / Self Care | Payer: Medicare Other | Source: Ambulatory Visit | Attending: Family Medicine | Admitting: Family Medicine

## 2020-08-19 ENCOUNTER — Other Ambulatory Visit: Payer: Self-pay

## 2020-08-19 DIAGNOSIS — N6489 Other specified disorders of breast: Secondary | ICD-10-CM | POA: Insufficient documentation

## 2020-08-19 DIAGNOSIS — R928 Other abnormal and inconclusive findings on diagnostic imaging of breast: Secondary | ICD-10-CM | POA: Diagnosis not present

## 2020-08-19 DIAGNOSIS — N6032 Fibrosclerosis of left breast: Secondary | ICD-10-CM | POA: Diagnosis not present

## 2020-08-19 HISTORY — PX: BREAST BIOPSY: SHX20

## 2020-08-20 LAB — SURGICAL PATHOLOGY

## 2020-08-24 ENCOUNTER — Encounter: Payer: Self-pay | Admitting: Family Medicine

## 2020-08-24 ENCOUNTER — Ambulatory Visit (INDEPENDENT_AMBULATORY_CARE_PROVIDER_SITE_OTHER): Payer: Medicare Other | Admitting: Family Medicine

## 2020-08-24 ENCOUNTER — Other Ambulatory Visit: Payer: Self-pay

## 2020-08-24 VITALS — BP 119/59 | HR 71 | Ht 61.0 in | Wt 233.8 lb

## 2020-08-24 DIAGNOSIS — Z713 Dietary counseling and surveillance: Secondary | ICD-10-CM | POA: Diagnosis not present

## 2020-08-24 DIAGNOSIS — Z6841 Body Mass Index (BMI) 40.0 and over, adult: Secondary | ICD-10-CM | POA: Diagnosis not present

## 2020-08-24 NOTE — Progress Notes (Signed)
Subjective:    Patient ID: Diana Mcneil, female    DOB: [birth year], 75 y.o.   MRN: [id number]  Diana Mcneil is a 75 y.o. female presenting on 08/24/2020 for Obesity   HPI  Morbid Obesity BMI >44 Since last visit. Could not continue Wegovy >$1600. She was tried on [employer], without results She is on natural supplement, antioxidant weight loss regimen OTC Diet: improved, reduced portion, higher protein / fruit / salads veggies, limiting sugars, starches, drinking mostly water  In past 3 weeks, lost 18 lbs, feeling good, and doing well.  No new concerns on her weight, she plans to keep progressing toward goals.   Health Maintenance:  She had mammogram abnormal recently and had biopsy done 08/19/20, results over weekend showed negative for atypia or malignancy, results below, next repeat 1 year.  Depression screen [facility] 2/9 08/24/2020 07/20/2020 05/25/2020  Decreased Interest 0 0 0  Down, Depressed, Hopeless 0 0 0  PHQ - 2 Score 0 0 0  Altered sleeping 1 - 0  Tired, decreased energy 0 - 2  Change in appetite 0 - 0  Feeling bad or failure about yourself  0 - 0  Trouble concentrating 0 - 0  Moving slowly or fidgety/restless 0 - 0  Suicidal thoughts 0 - 0  PHQ-9 Score 1 - 2  Difficult doing work/chores Not difficult at all - Somewhat difficult    Social History   Tobacco Use   Smoking status: Former    Packs/day: 0.50    Years: 40.00    Pack years: 20.00    Types: Cigarettes   Smokeless tobacco: Former  Scientific laboratory technician Use: Never used  Substance Use Topics   Alcohol use: Never   Drug use: Never    Review of Systems Per HPI unless specifically indicated above     Objective:    BP (!) 119/59   Pulse 71   Ht 5\' 1"  (1.549 m)   Wt 233 lb 12.8 oz (106.1 kg)   SpO2 95%   BMI 44.18 kg/m   Wt Readings from Last 3 Encounters:  08/24/20 233 lb 12.8 oz (106.1 kg)  07/20/20 230 lb (104.3 kg)  05/25/20 237 lb 12.8 oz (107.9 kg)    Physical Exam Vitals  and nursing note reviewed.  Constitutional:      General: She is not in acute distress.    Appearance: Normal appearance. She is well-developed. She is obese. She is not diaphoretic.     Comments: Well-appearing, comfortable, cooperative  HENT:     Head: Normocephalic and atraumatic.  Eyes:     General:        Right eye: No discharge.        Left eye: No discharge.     Conjunctiva/sclera: Conjunctivae normal.  Cardiovascular:     Rate and Rhythm: Normal rate.  Pulmonary:     Effort: Pulmonary effort is normal.  Skin:    General: Skin is warm and dry.     Findings: No erythema or rash.  Neurological:     Mental Status: She is alert and oriented to person, place, and time.  Psychiatric:        Mood and Affect: Mood normal.        Behavior: Behavior normal.        Thought Content: Thought content normal.     Comments: Well groomed, good eye contact, normal speech and thoughts      Results for orders placed or performed  during the hospital encounter of 08/19/20  Surgical pathology  Result Value Ref Range   SURGICAL PATHOLOGY      SURGICAL PATHOLOGY CASE: [id number] PATIENT: [name] Surgical Pathology Report     Specimen Submitted: A. Breast, left  Clinical History: 75 YO F with 0.5 cm asymmetry in upper left breast. TIF - 9:10 am. CIT < 5 minutes      DIAGNOSIS: A. BREAST ASYMMETRY, LEFT UPPER; STEREOTACTIC BIOPSY: - PREDOMINANTLY MATURE ADIPOSE TISSUE WITH FOCAL AREAS OF BENIGN MAMMARY TISSUE WITH SCLEROSING ADENOSIS, STROMAL FIBROSIS, AND FOCAL CYST FORMATION. - NEGATIVE FOR ATYPIA AND MALIGNANCY.  Comment: The majority of the tissue submitted consists of mature adipose tissue. There are focal areas of benign breast tissue with sclerosing adenosis and stromal fibrosis. These areas measure approximately 5 mm. Correlation with radiographic findings is required to determine if these findings correlate with the targeted lesion.  GROSS  DESCRIPTION: A. Labeled: Left breast stereo biopsy asymmetry upper Received: in a formalin-filled [place] collection device Specimen ra diograph image(s) available for review Time/Date in fixative: Collected at 9:12 AM on 08/19/2020 and placed in formalin at 9:18 AM on 08/19/2020 Cold ischemic time: Less than 10 minutes Total fixation time: Approximately 8 hours Core pieces: Multiple Measurement: Aggregate, 9.8 x 1.5 x 0.4 cm Description / comments: Received are cores and fragments of yellow fibrofatty tissue.  A distinct diagram is not received on the container or with the requisition. Inked: Green Entirely submitted in cassette(s):  1 - sections A and B 2 - sections C and D 3 - sections E and F 4 - sections G and H 5 - sections I and J 6 - sections K, L, and remaining free-floating fragments  RB 08/19/2020  Final Diagnosis performed by Quay Burow, MD.   Electronically signed 08/20/2020 9:54:22AM The electronic signature indicates that the named Attending Pathologist has evaluated the specimen Technical component performed at [facility], [address] [place], [place] [address] Lab: [phone] Dir : Rush Farmer, MD, MMM  Professional component performed at [facility], [facility], [place], [place], [place] [address] Lab: [phone] Dir: Dellia Nims. Rubinas, MD       Assessment & Plan:   Problem List Items Addressed This Visit     Morbid obesity with BMI of 40.0-44.9, adult ([place]) - Primary    Significant improvement on OTC natural antioxidant supplements for weight loss Down 18 lbs with diet and lifestyle changes, reduced portions, improved food choices, drinking water Continue on current regimen Remain OFF GLP1 therapy, due to cost and limited result on samples.   No orders of the defined types were placed in this encounter.     Follow up plan: Return in about 6 months (around 02/24/2021) for 6 month follow-up Weight  Management.   Nobie Putnam, [facility] 08/24/2020, 1:47 PM

## 2020-08-24 NOTE — Patient Instructions (Addendum)
Thank you for coming to the office today.  Keep up the good work on weight loss!  Good news on biopsy.  Reminder to schedule Annual Medicare Wellness for both of you.  Please schedule a Follow-up Appointment to: Return in about 6 months (around 02/24/2021) for 6 month follow-up Weight Management.  If you have any other questions or concerns, please feel free to call the office or send a message through [place]. You may also schedule an earlier appointment if necessary.  Additionally, you may be receiving a survey about your experience at our office within a few days to 1 week by e-mail or mail. We value your feedback.  Nobie Putnam, DO [place]

## 2020-08-25 ENCOUNTER — Other Ambulatory Visit: Payer: Self-pay | Admitting: Family Medicine

## 2020-08-25 DIAGNOSIS — G43809 Other migraine, not intractable, without status migrainosus: Secondary | ICD-10-CM

## 2020-08-25 NOTE — Telephone Encounter (Signed)
Requested medications are due for refill today.  Most likely  Requested medications are on the active medications list.  Yes, as a historical med.  Last refill. 08/13/2018  Future visit scheduled.   yes  Notes to clinic.  Historical medication.

## 2020-08-25 NOTE — Telephone Encounter (Signed)
Copied from [place] [phone]. Topic: Quick Communication - Rx Refill/Question >> Aug 25, 2020  3:30 PM Yvette Rack wrote: Medication: SUMAtriptan (IMITREX) 100 MG tablet  Has the patient contacted their pharmacy? No. Pt requests Rx be sent to new pharmacy  (Agent: If no, request that the patient contact the pharmacy for the refill.) (Agent: If yes, when and what did the pharmacy advise?)  Preferred Pharmacy (with phone number or street name): [id number] - [place], [place] - [place] Phone: [phone]  Fax: [phone]  Agent: Please be advised that RX refills may take up to 3 business days. We ask that you follow-up with your pharmacy.

## 2020-08-26 MED ORDER — SUMATRIPTAN SUCCINATE 100 MG PO TABS: 100.0000 mg | ORAL_TABLET | Freq: Two times a day (BID) | ORAL | 2 refills | Status: DC

## 2020-10-28 ENCOUNTER — Other Ambulatory Visit: Payer: Self-pay | Admitting: Family Medicine

## 2020-10-28 DIAGNOSIS — M810 Age-related osteoporosis without current pathological fracture: Secondary | ICD-10-CM

## 2020-10-28 NOTE — Telephone Encounter (Signed)
Requested medication (s) are due for refill today: Yes  Requested medication (s) are on the active medication list: Yes  Last refill:  11/10/19  Future visit scheduled: Yes  Notes to clinic:  Protocol indicates pt. Needs lab work.    Requested Prescriptions  Pending Prescriptions Disp Refills   alendronate (FOSAMAX) 70 MG tablet [Pharmacy Med Name: ALENDRONATE 70MG  TABLETS] 12 tablet 3    Sig: TAKE 1 TABLET(70 MG) BY MOUTH 1 TIME A WEEK WITH A FULL GLASS OF WATER AND ON AN EMPTY STOMACH     Endocrinology:  Bisphosphonates Failed - 10/28/2020 10:17 AM      Failed - Vitamin D in normal range and within 360 days    No results found for: [id number], [id number], ZH086VH8ION, 25OHVITD3, 25OHVITD2, 25OHVITD3, 25OHVITD2, 25OHVITD1, 25OHVITD2, 25OHVITD3, VD25OH        Passed - Ca in normal range and within 360 days    Calcium  Date Value Ref Range Status  05/25/2020 8.9 8.6 - 10.4 mg/dL Final          Passed - Valid encounter within last 12 months    Recent Outpatient Visits           2 months ago Morbid obesity with BMI of 40.0-44.9, adult [facility])   [place], DO   5 months ago Annual physical exam   [place], DO   7 months ago Major depression, recurrent, full remission [facility])   [place], DO   10 months ago Chronic pain of right knee   [place], DO   11 months ago Osteoporosis of lumbar spine   [place], DO       Future Appointments             In 4 months Parks Ranger, Devonne Doughty, [facility], [place]   In 9 months  [facility], [facility]

## 2020-12-12 ENCOUNTER — Other Ambulatory Visit: Payer: Self-pay | Admitting: Family Medicine

## 2020-12-12 DIAGNOSIS — G43809 Other migraine, not intractable, without status migrainosus: Secondary | ICD-10-CM

## 2020-12-12 NOTE — Telephone Encounter (Signed)
Requested Prescriptions  Pending Prescriptions Disp Refills  . SUMAtriptan (IMITREX) 100 MG tablet [Pharmacy Med Name: SUMATRIPTAN 100MG  TABLETS] 10 tablet 2    Sig: TAKE 1 TABLET(100 MG) BY MOUTH IN THE MORNING AND AT BEDTIME     Neurology:  Migraine Therapy - Triptan Passed - 12/12/2020  3:27 AM      Passed - Last BP in normal range    BP Readings from Last 1 Encounters:  08/24/20 (!) 119/59         Passed - Valid encounter within last 12 months    Recent Outpatient Visits          3 months ago Morbid obesity with BMI of 40.0-44.9, adult [facility])   [place], DO   6 months ago Annual physical exam   [place], DO   8 months ago Major depression, recurrent, full remission [facility])   [place], DO   11 months ago Chronic pain of right knee   [place], DO   1 year ago Osteoporosis of lumbar spine   [place], DO      Future Appointments            In 2 months Parks Ranger, Devonne Doughty, [facility], [place]   In 7 months  [facility], [facility]

## 2020-12-29 ENCOUNTER — Other Ambulatory Visit: Payer: Self-pay | Admitting: Family Medicine

## 2020-12-29 DIAGNOSIS — M1711 Unilateral primary osteoarthritis, right knee: Secondary | ICD-10-CM

## 2020-12-29 DIAGNOSIS — G8929 Other chronic pain: Secondary | ICD-10-CM

## 2020-12-29 NOTE — Telephone Encounter (Signed)
Requested medications are due for refill today.  yes  Requested medications are on the active medications list.  yes  Last refill. 03/19/2020  Future visit scheduled.   yes  Notes to clinic.  Please update Sig.

## 2021-01-04 ENCOUNTER — Other Ambulatory Visit: Payer: Self-pay | Admitting: Family Medicine

## 2021-01-04 DIAGNOSIS — G43809 Other migraine, not intractable, without status migrainosus: Secondary | ICD-10-CM

## 2021-01-04 NOTE — Telephone Encounter (Signed)
Medication Refill - Medication: SUMAtriptan (IMITREX) 100 MG tablet /rosuvasttin 20 mg   pantopracole 40 mg/  fluticasone 50 mg/montelukast (SINGULAIR) 10 MG tablet [[id number]]  Has the patient contacted their pharmacy? yes (Agent: If no, request that the patient contact the pharmacy for the refill. If patient does not wish to contact the pharmacy document the reason why and proceed with request.) (Agent: If yes, when and what did the pharmacy advise?)contact pcp  Preferred Pharmacy (with phone number or street name):  [id number] - [place], [place] - [place] Phone:  [phone]  Fax:  [phone]     Has the patient been seen for an appointment in the last year OR does the patient have an upcoming appointment? yes  Agent: Please be advised that RX refills may take up to 3 business days. We ask that you follow-up with your pharmacy.

## 2021-01-06 NOTE — Telephone Encounter (Signed)
Requested medication (s) are due for refill today: Unclear, signature does not  state prn or scheduled.  Requested medication (s) are on the active medication list: yes  Future visit scheduled: yes 02/25/21  Notes to clinic:  signature does not state prn or scheduled, please assess.      Requested Prescriptions  Pending Prescriptions Disp Refills   SUMAtriptan (IMITREX) 100 MG tablet 10 tablet 2    Sig: TAKE 1 TABLET(100 MG) BY MOUTH IN THE MORNING AND AT BEDTIME     Neurology:  Migraine Therapy - Triptan Passed - 01/06/2021  3:41 AM      Passed - Last BP in normal range    BP Readings from Last 1 Encounters:  08/24/20 (!) 119/59          Passed - Valid encounter within last 12 months    Recent Outpatient Visits           4 months ago Morbid obesity with BMI of 40.0-44.9, adult [facility])   [place], DO   7 months ago Annual physical exam   [place], DO   9 months ago Major depression, recurrent, full remission [facility])   [place], DO   1 year ago Chronic pain of right knee   [place], DO   1 year ago Osteoporosis of lumbar spine   [facility] Olin Hauser, DO       Future Appointments             In 1 month Parks Ranger, Devonne Doughty, [facility], [place]   In 6 months  [facility], [facility]

## 2021-01-07 MED ORDER — SUMATRIPTAN SUCCINATE 100 MG PO TABS
ORAL_TABLET | ORAL | 2 refills | Status: DC
Start: 2021-01-07 — End: 2021-03-30

## 2021-01-11 ENCOUNTER — Telehealth: Payer: Self-pay | Admitting: Family Medicine

## 2021-01-11 DIAGNOSIS — K219 Gastro-esophageal reflux disease without esophagitis: Secondary | ICD-10-CM

## 2021-01-11 DIAGNOSIS — E785 Hyperlipidemia, unspecified: Secondary | ICD-10-CM

## 2021-01-11 DIAGNOSIS — M810 Age-related osteoporosis without current pathological fracture: Secondary | ICD-10-CM

## 2021-01-11 DIAGNOSIS — J3089 Other allergic rhinitis: Secondary | ICD-10-CM

## 2021-01-11 MED ORDER — MONTELUKAST SODIUM 10 MG PO TABS: 10.0000 mg | ORAL_TABLET | Freq: Every day | ORAL | 3 refills | Status: DC

## 2021-01-11 MED ORDER — PANTOPRAZOLE SODIUM 40 MG PO TBEC: 40.0000 mg | DELAYED_RELEASE_TABLET | Freq: Every day | ORAL | 1 refills | Status: DC

## 2021-01-11 MED ORDER — ROSUVASTATIN CALCIUM 20 MG PO TABS: 20.0000 mg | ORAL_TABLET | Freq: Every day | ORAL | 3 refills | Status: DC

## 2021-01-11 MED ORDER — AMOXICILLIN 500 MG PO CAPS
2000.0000 mg | ORAL_CAPSULE | Freq: Once | ORAL | 0 refills | Status: DC | PRN
Start: 2021-01-11 — End: 2022-04-05

## 2021-01-11 MED ORDER — ALENDRONATE SODIUM 70 MG PO TABS: ORAL_TABLET | ORAL | 3 refills | Status: DC

## 2021-01-11 NOTE — Telephone Encounter (Signed)
Medication Refill - Medication:  fluticasone (FLONASE) 50 MCG/ACT nasal spray alendronate (FOSAMAX) 70 MG tablet montelukast (SINGULAIR) 10 MG tablet rosuvastatin (CRESTOR) 20 MG tablet  pantoprazole (PROTONIX) 40 MG tablet   Also says she needs amoxicillin prior to her appt with the dentist  Pt is completely out   Has the patient contacted their pharmacy? Yes.   (Agent: If no, request that the patient contact the pharmacy for the refill. If patient does not wish to contact the pharmacy document the reason why and proceed with request.) (Agent: If yes, when and what did the pharmacy advise?)  Preferred Pharmacy (with phone number or street name):  [id number] - [place], [place] - [place] [facility] [place]  [address] [place] [address]  Phone: [phone] Fax: [phone]   Has the patient been seen for an appointment in the last year OR does the patient have an upcoming appointment? Yes.    Agent: Please be advised that RX refills may take up to 3 business days. We ask that you follow-up with your pharmacy.

## 2021-02-25 ENCOUNTER — Ambulatory Visit: Payer: Medicare Other | Admitting: Family Medicine

## 2021-03-28 ENCOUNTER — Ambulatory Visit: Payer: Self-pay | Admitting: *Deleted

## 2021-03-28 NOTE — Telephone Encounter (Signed)
Pitting edema in legs  Chief Complaint: swelling in legs Symptoms: swelling Frequency: constant Pertinent Negatives: Patient denies weeping  Disposition: [] ED /[] Urgent Care (no appt availability in office) / [x] Appointment(In office/virtual)/ []  [facility]/ [] Home Care/ [] Refused Recommended Disposition /[] [facility]/ []  Follow-up with PCP Additional Notes: Pt already has appt for 03/30/21, I tried to get her to take Dr. Parks Ranger open appt today and she would not.   Reason for Disposition  [1] MILD swelling of both ankles (i.e., pedal edema) AND [2] new-onset or worsening  Answer Assessment - Initial Assessment Questions 1. ONSET: "When did the swelling start?" (e.g., minutes, hours, days)     Longer than two months 2. LOCATION: "What part of the leg is swollen?"  "Are both legs swollen or just one leg?"     Lower legs 3. SEVERITY: "How bad is the swelling?" (e.g., localized; mild, moderate, severe)  - Localized - small area of swelling localized to one leg  - MILD pedal edema - swelling limited to foot and ankle, pitting edema < 1/4 inch (6 mm) deep, rest and elevation eliminate most or all swelling  - MODERATE edema - swelling of lower leg to knee, pitting edema > 1/4 inch (6 mm) deep, rest and elevation only partially reduce swelling  - SEVERE edema - swelling extends above knee, facial or hand swelling present      pitting 4. REDNESS: "Does the swelling look red or infected?"     red 5. PAIN: "Is the swelling painful to touch?" If Yes, ask: "How painful is it?"   (Scale 1-10; mild, moderate or severe)    no 6. FEVER: "Do you have a fever?" If Yes, ask: "What is it, how was it measured, and when did it start?"      no 7. CAUSE: "What do you think is causing the leg swelling?"     unknown 8. MEDICAL HISTORY: "Do you have a history of heart failure, kidney disease, liver failure, or cancer?"     Not aware of 9. RECURRENT SYMPTOM: "Have you had leg  swelling before?" If Yes, ask: "When was the last time?" "What happened that time?"     first 10. OTHER SYMPTOMS: "Do you have any other symptoms?" (e.g., chest pain, difficulty breathing)       Yes, sob and tired. 11. PREGNANCY: "Is there any chance you are pregnant?" "When was your last menstrual period?"       na  Protocols used: Leg Swelling and Edema-A-AH

## 2021-03-30 ENCOUNTER — Ambulatory Visit (INDEPENDENT_AMBULATORY_CARE_PROVIDER_SITE_OTHER): Payer: Medicare Other | Admitting: Family Medicine

## 2021-03-30 ENCOUNTER — Other Ambulatory Visit: Payer: Self-pay

## 2021-03-30 ENCOUNTER — Encounter: Payer: Self-pay | Admitting: Family Medicine

## 2021-03-30 VITALS — BP 127/75 | HR 69 | Ht 61.0 in | Wt 240.0 lb

## 2021-03-30 DIAGNOSIS — G43809 Other migraine, not intractable, without status migrainosus: Secondary | ICD-10-CM | POA: Diagnosis not present

## 2021-03-30 DIAGNOSIS — Z23 Encounter for immunization: Secondary | ICD-10-CM

## 2021-03-30 DIAGNOSIS — F3342 Major depressive disorder, recurrent, in full remission: Secondary | ICD-10-CM

## 2021-03-30 DIAGNOSIS — J0110 Acute frontal sinusitis, unspecified: Secondary | ICD-10-CM | POA: Diagnosis not present

## 2021-03-30 DIAGNOSIS — J3089 Other allergic rhinitis: Secondary | ICD-10-CM

## 2021-03-30 MED ORDER — FLUTICASONE PROPIONATE 50 MCG/ACT NA SUSP: 2.0000 | Freq: Every day | NASAL | 3 refills | Status: DC

## 2021-03-30 MED ORDER — SUMATRIPTAN SUCCINATE 100 MG PO TABS: 100.0000 mg | ORAL_TABLET | ORAL | 5 refills | Status: DC | PRN

## 2021-03-30 MED ORDER — MONTELUKAST SODIUM 10 MG PO TABS: 10.0000 mg | ORAL_TABLET | Freq: Every day | ORAL | 3 refills | Status: DC

## 2021-03-30 MED ORDER — DULOXETINE HCL 20 MG PO CPEP: 20.0000 mg | ORAL_CAPSULE | Freq: Two times a day (BID) | ORAL | 3 refills | Status: DC

## 2021-03-30 NOTE — Patient Instructions (Addendum)
Thank you for coming to the office today.  Recent Labs    05/25/20 0925  HGBA1C 5.1   All meds refilled as discussed, Duloxetine 20mg  twice a day  Flonase refill.  Use RICE therapy: - R - Rest / relative rest with activity modification avoid overuse of joint - I - Ice packs (make sure you use a towel or sock / something to protect skin) - C - Compression with stockings / ACE wrap to apply pressure and reduce swelling allowing more support - E - Elevation - if significant swelling, lift leg above heart level (toes above your nose) to help reduce swelling, most helpful at night after day of being on your feet   DUE for FASTING BLOOD WORK (no food or drink after midnight before the lab appointment, only water or coffee without cream/sugar on the morning of)  SCHEDULE "Lab Only" visit in the morning at the clinic for lab draw in 2 MONTHS   - Make sure Lab Only appointment is at about 1 week before your next appointment, so that results will be available  For Lab Results, once available within 2-3 days of blood draw, you can can log in to MyChart online to view your results and a brief explanation. Also, we can discuss results at next follow-up visit.   Please schedule a Follow-up Appointment to: Return in about 2 months (around 05/28/2021) for 2 month fasting lab only then 1 week later Annual Physical.  If you have any other questions or concerns, please feel free to call the office or send a message through [place]. You may also schedule an earlier appointment if necessary.  Additionally, you may be receiving a survey about your experience at our office within a few days to 1 week by e-mail or mail. We value your feedback.  Nobie Putnam, DO [place]

## 2021-03-30 NOTE — Progress Notes (Signed)
Subjective:    Patient ID: Diana Mcneil, female    DOB: 1945-10-23, 76 y.o.   MRN: [id number]  Diana Mcneil is a 76 y.o. female presenting on 03/30/2021 for Anxiety   HPI  Anxiety, panic attack 02/25/21 missed apt She has had issues with multiple meds running out, previous PCP name on list. Called to get med but unsuccessful Out of duloxetine - was taking Duloxetine 20mg  BID  LE Edema / Venous Insufficiency Previously on compression but not currently using. Limited elevation. Prolonged seated with legs down. No significant pain. Both sides swollen foot ankle leg.  Morbid Obesity BMI >45 Since last visit. Could not continue Wegovy >$1600. She was tried on [employer], without results She is on natural supplement, antioxidant weight loss regimen OTC Diet: improved, reduced portion, higher protein / fruit / salads veggies, limiting sugars, starches, drinking mostly water   In past 3 weeks, lost 18 lbs, feeling good, and doing well.   No new concerns on her weight, she plans to keep progressing toward goals.  Major Depression recurrent, in full remission Controlled. On Duloxetine 20mg  BID, in remission, no significant concerns. However out of med recently. Will need re order  Skin lesion, growth SK R neck lateral. Recent increase in size. Some redness at base.    Depression screen [facility] 2/9 03/30/2021 08/24/2020 07/20/2020  Decreased Interest 0 0 0  Down, Depressed, Hopeless 0 0 0  PHQ - 2 Score 0 0 0  Altered sleeping 1 1 -  Tired, decreased energy 0 0 -  Change in appetite 0 0 -  Feeling bad or failure about yourself  0 0 -  Trouble concentrating 0 0 -  Moving slowly or fidgety/restless 0 0 -  Suicidal thoughts 0 0 -  PHQ-9 Score 1 1 -  Difficult doing work/chores Not difficult at all Not difficult at all -    Social History   Tobacco Use   Smoking status: Former    Packs/day: 0.50    Years: 40.00    Pack years: 20.00    Types: Cigarettes   Smokeless  tobacco: Former  Scientific laboratory technician Use: Never used  Substance Use Topics   Alcohol use: Never   Drug use: Never    Review of Systems Per HPI unless specifically indicated above     Objective:    BP 127/75    Pulse 69    Ht 5\' 1"  (1.549 m)    Wt 240 lb (108.9 kg)    SpO2 99%    BMI 45.35 kg/m   Wt Readings from Last 3 Encounters:  03/30/21 240 lb (108.9 kg)  08/24/20 233 lb 12.8 oz (106.1 kg)  07/20/20 230 lb (104.3 kg)    Physical Exam Vitals and nursing note reviewed.  Constitutional:      General: She is not in acute distress.    Appearance: She is well-developed. She is obese. She is not diaphoretic.     Comments: Well-appearing, comfortable, cooperative  HENT:     Head: Normocephalic and atraumatic.  Eyes:     General:        Right eye: No discharge.        Left eye: No discharge.     Conjunctiva/sclera: Conjunctivae normal.  Neck:     Thyroid: No thyromegaly.  Cardiovascular:     Rate and Rhythm: Normal rate and regular rhythm.     Heart sounds: Normal heart sounds. No murmur heard. Pulmonary:  Effort: Pulmonary effort is normal. No respiratory distress.     Breath sounds: Normal breath sounds. No wheezing or rales.  Musculoskeletal:        General: Normal range of motion.     Cervical back: Normal range of motion and neck supple.     Right lower leg: Edema present.     Left lower leg: Edema (+2-3 bilateral symmetrical w/o erythema) present.  Lymphadenopathy:     Cervical: No cervical adenopathy.  Skin:    General: Skin is warm and dry.     Findings: No erythema or rash.  Neurological:     Mental Status: She is alert and oriented to person, place, and time.  Psychiatric:        Behavior: Behavior normal.     Comments: Well groomed, good eye contact, normal speech and thoughts   Results for orders placed or performed during the hospital encounter of 08/19/20  Surgical pathology  Result Value Ref Range   SURGICAL PATHOLOGY      SURGICAL  PATHOLOGY CASE: [id number] PATIENT: Diana Mcneil Surgical Pathology Report     Specimen Submitted: A. Breast, left  Clinical History: 76 YO F with 0.5 cm asymmetry in upper left breast. TIF - 9:10 am. CIT < 5 minutes      DIAGNOSIS: A. BREAST ASYMMETRY, LEFT UPPER; STEREOTACTIC BIOPSY: - PREDOMINANTLY MATURE ADIPOSE TISSUE WITH FOCAL AREAS OF BENIGN MAMMARY TISSUE WITH SCLEROSING ADENOSIS, STROMAL FIBROSIS, AND FOCAL CYST FORMATION. - NEGATIVE FOR ATYPIA AND MALIGNANCY.  Comment: The majority of the tissue submitted consists of mature adipose tissue. There are focal areas of benign breast tissue with sclerosing adenosis and stromal fibrosis. These areas measure approximately 5 mm. Correlation with radiographic findings is required to determine if these findings correlate with the targeted lesion.  GROSS DESCRIPTION: A. Labeled: Left breast stereo biopsy asymmetry upper Received: in a formalin-filled [place] collection device Specimen ra diograph image(s) available for review Time/Date in fixative: Collected at 9:12 AM on 08/19/2020 and placed in formalin at 9:18 AM on 08/19/2020 Cold ischemic time: Less than 10 minutes Total fixation time: Approximately 8 hours Core pieces: Multiple Measurement: Aggregate, 9.8 x 1.5 x 0.4 cm Description / comments: Received are cores and fragments of yellow fibrofatty tissue.  A distinct diagram is not received on the container or with the requisition. Inked: Green Entirely submitted in cassette(s):  1 - sections A and B 2 - sections C and D 3 - sections E and F 4 - sections G and H 5 - sections I and J 6 - sections K, L, and remaining free-floating fragments  RB 08/19/2020  Final Diagnosis performed by Quay Burow, MD.   Electronically signed 08/20/2020 9:54:22AM The electronic signature indicates that the named Attending Pathologist has evaluated the specimen Technical component performed at [facility], [address],  [place], [place] [address] Lab: [phone] Dir : Rush Farmer, MD, MMM  Professional component performed at [facility], [facility], [place], [place], [place] [address] Lab: [phone] Dir: Dellia Nims. Rubinas, MD       Assessment & Plan:   Problem List Items Addressed This Visit     Major depression, recurrent, full remission ([place]) - Primary   Relevant Medications   DULoxetine (CYMBALTA) 20 MG capsule   Other Visit Diagnoses     Other migraine without status migrainosus, not intractable       Relevant Medications   DULoxetine (CYMBALTA) 20 MG capsule   SUMAtriptan (IMITREX) 100 MG tablet  Acute non-recurrent frontal sinusitis       Relevant Medications   fluticasone (FLONASE) 50 MCG/ACT nasal spray   Environmental and seasonal allergies       Relevant Medications   montelukast (SINGULAIR) 10 MG tablet   Needs flu shot           LE Ext Edema Likely venous insufficiency some stasis dermatitis. No other clear cause today RICE therapy Consider lasix diuretic in future as PRN  Migraine Major depression Anxiety Recent anxiety flare, can trigger migraines Re order Sumatriptan #10 pill count with precautions on dosage, + refills  Rhinosinusitis Start nasal steroid Flonase 2 sprays in each nostril daily for 4-6 weeks, may repeat course seasonally or as needed Refill Montelukast  Meds ordered this encounter  Medications   DULoxetine (CYMBALTA) 20 MG capsule    Sig: Take 1 capsule (20 mg total) by mouth in the morning and at bedtime.    Dispense:  180 capsule    Refill:  3   fluticasone (FLONASE) 50 MCG/ACT nasal spray    Sig: Place 2 sprays into both nostrils daily.    Dispense:  48 g    Refill:  3   SUMAtriptan (IMITREX) 100 MG tablet    Sig: Take 1 tablet (100 mg total) by mouth every 2 (two) hours as needed for migraine. May repeat in 2 hours if headache persists or recurs.    Dispense:  10 tablet    Refill:  5    New instructions.    montelukast (SINGULAIR) 10 MG tablet    Sig: Take 1 tablet (10 mg total) by mouth at bedtime.    Dispense:  90 tablet    Refill:  3      Follow up plan: Return in about 2 months (around 05/28/2021) for 2 month fasting lab only then 1 week later Annual Physical.  Future labs ordered for full lab panel 05/2021  Nobie Putnam, [facility] 03/30/2021, 2:19 PM

## 2021-03-31 ENCOUNTER — Other Ambulatory Visit: Payer: Self-pay | Admitting: Family Medicine

## 2021-03-31 DIAGNOSIS — Z0000 Encounter for general adult medical examination without abnormal findings: Secondary | ICD-10-CM

## 2021-03-31 DIAGNOSIS — Z6841 Body Mass Index (BMI) 40.0 and over, adult: Secondary | ICD-10-CM

## 2021-03-31 DIAGNOSIS — F3342 Major depressive disorder, recurrent, in full remission: Secondary | ICD-10-CM

## 2021-03-31 DIAGNOSIS — E785 Hyperlipidemia, unspecified: Secondary | ICD-10-CM

## 2021-03-31 DIAGNOSIS — R7309 Other abnormal glucose: Secondary | ICD-10-CM

## 2021-05-03 ENCOUNTER — Other Ambulatory Visit: Payer: Self-pay | Admitting: Family Medicine

## 2021-05-03 DIAGNOSIS — Z1231 Encounter for screening mammogram for malignant neoplasm of breast: Secondary | ICD-10-CM

## 2021-06-01 ENCOUNTER — Other Ambulatory Visit: Payer: Medicare Other

## 2021-06-01 DIAGNOSIS — R7309 Other abnormal glucose: Secondary | ICD-10-CM

## 2021-06-01 DIAGNOSIS — Z0000 Encounter for general adult medical examination without abnormal findings: Secondary | ICD-10-CM

## 2021-06-01 DIAGNOSIS — E785 Hyperlipidemia, unspecified: Secondary | ICD-10-CM

## 2021-06-01 DIAGNOSIS — F3342 Major depressive disorder, recurrent, in full remission: Secondary | ICD-10-CM

## 2021-06-02 LAB — LIPID PANEL
Cholesterol: 160 mg/dL (ref <200)
HDL: 76 mg/dL (ref >=50)
LDL Cholesterol (Calc): 68 mg/dL (calc)
Non-HDL Cholesterol (Calc): 84 mg/dL (calc) (ref <130)
Total CHOL/HDL Ratio: 2.1 (calc) (ref <5.0)
Triglycerides: 75 mg/dL (ref <150)

## 2021-06-02 LAB — CBC WITH DIFFERENTIAL/PLATELET
Absolute Monocytes: 446 cells/uL (ref 200–950)
Basophils Absolute: 29 cells/uL (ref 0–200)
Basophils Relative: 0.6 %
Eosinophils Absolute: 172 cells/uL (ref 15–500)
Eosinophils Relative: 3.5 %
HCT: 40.9 % (ref 35.0–45.0)
Hemoglobin: 13.1 g/dL (ref 11.7–15.5)
Lymphs Abs: 1784 cells/uL (ref 850–3900)
MCH: 30.3 pg (ref 27.0–33.0)
MCHC: 32 g/dL (ref 32.0–36.0)
MCV: 94.7 fL (ref 80.0–100.0)
MPV: 11.4 fL (ref 7.5–12.5)
Monocytes Relative: 9.1 %
Neutro Abs: 2470 cells/uL (ref 1500–7800)
Neutrophils Relative %: 50.4 %
Platelets: 156 10*3/uL (ref 140–400)
RBC: 4.32 10*6/uL (ref 3.80–5.10)
RDW: 12.4 % (ref 11.0–15.0)
Total Lymphocyte: 36.4 %
WBC: 4.9 10*3/uL (ref 3.8–10.8)

## 2021-06-02 LAB — COMPLETE METABOLIC PANEL WITH GFR
AG Ratio: 1.7 (calc) (ref 1.0–2.5)
ALT: 13 U/L (ref 6–29)
AST: 20 U/L (ref 10–35)
Albumin: 3.8 g/dL (ref 3.6–5.1)
Alkaline phosphatase (APISO): 82 U/L (ref 37–153)
BUN: 20 mg/dL (ref 7–25)
CO2: 31 mmol/L (ref 20–32)
Calcium: 8.9 mg/dL (ref 8.6–10.4)
Chloride: 104 mmol/L (ref 98–110)
Creat: 0.68 mg/dL (ref 0.60–1.00)
Globulin: 2.2 g/dL (calc) (ref 1.9–3.7)
Glucose, Bld: 84 mg/dL (ref 65–99)
Potassium: 4 mmol/L (ref 3.5–5.3)
Sodium: 141 mmol/L (ref 135–146)
Total Bilirubin: 0.7 mg/dL (ref 0.2–1.2)
Total Protein: 6 g/dL — ABNORMAL LOW (ref 6.1–8.1)
eGFR: 91 mL/min/{1.73_m2} (ref >=60)

## 2021-06-02 LAB — HEMOGLOBIN A1C
Hgb A1c MFr Bld: 5.2 % of total Hgb (ref <5.7)
Mean Plasma Glucose: 103 mg/dL
eAG (mmol/L): 5.7 mmol/L

## 2021-06-02 LAB — TSH: TSH: 3.1 mIU/L (ref 0.40–4.50)

## 2021-06-06 ENCOUNTER — Other Ambulatory Visit: Payer: Self-pay | Admitting: Family Medicine

## 2021-06-06 ENCOUNTER — Ambulatory Visit (INDEPENDENT_AMBULATORY_CARE_PROVIDER_SITE_OTHER): Payer: Medicare Other | Admitting: Family Medicine

## 2021-06-06 ENCOUNTER — Encounter: Payer: Self-pay | Admitting: Family Medicine

## 2021-06-06 VITALS — BP 132/84 | HR 75 | Ht 61.0 in | Wt 250.8 lb

## 2021-06-06 DIAGNOSIS — Z0000 Encounter for general adult medical examination without abnormal findings: Secondary | ICD-10-CM

## 2021-06-06 DIAGNOSIS — I872 Venous insufficiency (chronic) (peripheral): Secondary | ICD-10-CM

## 2021-06-06 DIAGNOSIS — G4733 Obstructive sleep apnea (adult) (pediatric): Secondary | ICD-10-CM

## 2021-06-06 DIAGNOSIS — F3342 Major depressive disorder, recurrent, in full remission: Secondary | ICD-10-CM

## 2021-06-06 DIAGNOSIS — Z6841 Body Mass Index (BMI) 40.0 and over, adult: Secondary | ICD-10-CM

## 2021-06-06 DIAGNOSIS — M810 Age-related osteoporosis without current pathological fracture: Secondary | ICD-10-CM

## 2021-06-06 DIAGNOSIS — E785 Hyperlipidemia, unspecified: Secondary | ICD-10-CM

## 2021-06-06 DIAGNOSIS — K219 Gastro-esophageal reflux disease without esophagitis: Secondary | ICD-10-CM | POA: Diagnosis not present

## 2021-06-06 DIAGNOSIS — Z23 Encounter for immunization: Secondary | ICD-10-CM | POA: Diagnosis not present

## 2021-06-06 DIAGNOSIS — R7309 Other abnormal glucose: Secondary | ICD-10-CM

## 2021-06-06 DIAGNOSIS — R600 Localized edema: Secondary | ICD-10-CM

## 2021-06-06 MED ORDER — PANTOPRAZOLE SODIUM 40 MG PO TBEC: 40.0000 mg | DELAYED_RELEASE_TABLET | Freq: Every day | ORAL | 3 refills | Status: DC

## 2021-06-06 NOTE — Assessment & Plan Note (Signed)
Known OSA, not on CPAP ?Currently doing well without complications - but she does have daytime fatigue and tired, likely from OSA factor ?Reconsider future PSG - again she declines due to not wanting CPAP machine ?

## 2021-06-06 NOTE — Patient Instructions (Addendum)
Thank you for coming to the office today. ? ?Vascular ? ?[facility] ?[address] [place], [place] [address]  ?Main: [phone]  ? ?Use RICE therapy: ?- R - Rest / relative rest with activity modification avoid overuse of joint ?- I - Ice packs (make sure you use a towel or sock / something to protect skin) ?- C - Compression with stocking / ACE wrap to apply pressure and reduce swelling allowing more support ?- E - Elevation - if significant swelling, lift leg above heart level (toes above your nose) to help reduce swelling, most helpful at night after day of being on your feet ? ?We can consider diuretic in future if too much fluid retention swelling. ? ?[facility] ? ?[facility] ?[address] ?[place], [place] [address] ? ?Office: [phone] ?Fax: [phone] ? ?------------------------------ ? ?Shingrix vaccine #2 today ? ?May try to schedule at same time as other apt. ? ?For DEXA Scan (Bone mineral density) screening for osteoporosis ? ?Call the [facility] below anytime to schedule your own appointment now that order has been placed. ? ?[place] [place] ?[facility] ?[address] ?[place], [place] [address] ?Phone: [phone] ? ? ?Component ?    Latest Ref Rng 06/01/2021  ?WBC ?    3.8 - 10.8 Thousand/uL 4.9   ?RBC ?    3.80 - 5.10 Million/uL 4.32   ?Hemoglobin ?    11.7 - 15.5 g/dL 13.1   ?HCT ?    35.0 - 45.0 % 40.9   ?MCV ?    80.0 - 100.0 fL 94.7   ?MCH ?    27.0 - 33.0 pg 30.3   ?MCHC ?    32.0 - 36.0 g/dL 32.0   ?RDW ?    11.0 - 15.0 % 12.4   ?Platelets ?    140 - 400 Thousand/uL 156   ?MPV ?    7.5 - 12.5 fL 11.4   ?NEUT# ?    1,500 - 7,800 cells/uL 2,470   ?Lymphocyte # ?    850 - 3,900 cells/uL 1,784   ?Absolute Monocytes ?    200 - 950 cells/uL 446   ?Eosinophils Absolute ?    15 - 500 cells/uL 172   ?Basophils Absolute ?    0 - 200 cells/uL 29   ?Neutrophils ?    % 50.4   ?Total  Lymphocyte ?    % 36.4   ?Monocytes Relative ?    % 9.1   ?Eosinophil ?    % 3.5   ?Basophil ?    % 0.6   ?Glucose ?    65 - 99 mg/dL 84   ?BUN ?    7 - 25 mg/dL 20   ?Creatinine ?    0.60 - 1.00 mg/dL 0.68   ?eGFR ?    > OR = 60 mL/min/1.33m 91   ?BUN/Creatinine Ratio ?    6 - 22 (calc) NOT APPLICABLE   ?Sodium ?    135 - 146 mmol/L 141   ?Potassium ?    3.5 - 5.3 mmol/L 4.0   ?Chloride ?    98 - 110 mmol/L 104   ?CO2 ?    20 - 32 mmol/L 31   ?Calcium ?    8.6 - 10.4 mg/dL 8.9   ?Total Protein ?    6.1 - 8.1 g/dL 6.0 (L)   ?Albumin MSPROF ?    3.6 -  5.1 g/dL 3.8   ?Globulin ?    1.9 - 3.7 g/dL (calc) 2.2   ?AG Ratio ?    1.0 - 2.5 (calc) 1.7   ?Total Bilirubin ?    0.2 - 1.2 mg/dL 0.7   ?Alkaline phosphatase (APISO) ?    37 - 153 U/L 82   ?AST ?    10 - 35 U/L 20   ?ALT ?    6 - 29 U/L 13   ?Cholesterol ?    <200 mg/dL 160   ?HDL Cholesterol ?    > OR = 50 mg/dL 76   ?Triglycerides ?    <150 mg/dL 75   ?LDL Cholesterol (Calc) ?    mg/dL (calc) 68   ?Total CHOL/HDL Ratio ?    <5.0 (calc) 2.1   ?Non-HDL Cholesterol (Calc) ?    <130 mg/dL (calc) 84   ?Hemoglobin A1C ?    <5.7 % of total Hgb 5.2   ?Mean Plasma Glucose ?    mg/dL 103   ?eAG (mmol/L) ?    mmol/L 5.7   ?TSH ?    0.40 - 4.50 mIU/L 3.10   ?  ? ? ?Please schedule a Follow-up Appointment to: Return in about 1 year (around 06/07/2022) for 1 year fasting lab only then 1 week later Annual Physical. ? ?If you have any other questions or concerns, please feel free to call the office or send a message through [facility]. You may also schedule an earlier appointment if necessary. ? ?Additionally, you may be receiving a survey about your experience at our office within a few days to 1 week by e-mail or mail. We value your feedback. ? ?Nobie Putnam, DO ?[facility] ?

## 2021-06-06 NOTE — Progress Notes (Signed)
? ?Subjective:  ? ? Patient ID: Diana Mcneil, female    DOB: 09/09/1945, 76 y.o.   MRN: [id number] ? ?Diana Mcneil is a 76 y.o. female presenting on 06/06/2021 for Annual Exam ? ? ?HPI ? ?Here for Annual Physical and Lab Review ? ?HYPERLIPIDEMIA: ?- Reports no concerns. Last lipid panel 05/2021, controlled  ?- Currently taking Rosuvastatin '20mg'$ , tolerating well without side effects or myalgias ? ?Anxiety, panic attack ?02/25/21 missed apt ?She has had issues with multiple meds running out, previous PCP name on list. Called to get med but unsuccessful ?Out of duloxetine - was taking Duloxetine '20mg'$  BID ?  ?LE Edema / Venous Insufficiency ?Previously on compression but not currently using. Limited elevation. Prolonged seated with legs down. No significant pain. Both sides swollen foot ankle leg. ? ?Morbid Obesity BMI >47 ?A1c 5.2, normal range. ?Since last visit. Could not continue Wegovy >$1600. She was tried on [employer], without results ?She is on natural supplement, antioxidant weight loss regimen OTC ?Diet: improved, reduced portion, higher protein / fruit / salads veggies, limiting sugars, starches, drinking mostly water ?  ?In past 3 weeks, lost 18 lbs, feeling good, and doing well. ?  ?No new concerns on her weight, she plans to keep progressing toward goals. ?  ?Major Depression recurrent, in full remission ?Controlled. On Duloxetine '20mg'$  BID, in remission, no significant concerns. ?  ?Skin lesion, growth SK ?R neck lateral. Recent increase in size. Some redness at base. ? ?Health Maintenance: ?Shingrix vaccine  ? ? ?  06/06/2021  ?  1:41 PM 03/30/2021  ?  1:52 PM 08/24/2020  ?  1:27 PM  ?Depression screen PHQ 2/9  ?Decreased Interest 0 0 0  ?Down, Depressed, Hopeless 0 0 0  ?PHQ - 2 Score 0 0 0  ?Altered sleeping 0 1 1  ?Tired, decreased energy 1 0 0  ?Change in appetite 0 0 0  ?Feeling bad or failure about yourself  0 0 0  ?Trouble concentrating 0 0 0  ?Moving slowly or fidgety/restless 0 0 0  ?Suicidal  thoughts 0 0 0  ?PHQ-9 Score '1 1 1  '$ ?Difficult doing work/chores Not difficult at all Not difficult at all Not difficult at all  ? ? ?  03/30/2021  ?  1:52 PM 08/24/2020  ?  1:27 PM 03/19/2020  ? 10:40 AM 11/10/2019  ? 10:56 AM  ?GAD 7 : Generalized Anxiety Score  ?Nervous, Anxious, on Edge 0 0 1 0  ?Control/stop worrying '1 1 1 '$ 0  ?Worry too much - different things 0 0 1 0  ?Trouble relaxing 0 0 0 0  ?Restless 0 0 0 0  ?Easily annoyed or irritable 0 0 0 0  ?Afraid - awful might happen 0 0 0 0  ?Total GAD 7 Score '1 1 3 '$ 0  ?Anxiety Difficulty Not difficult at all Not difficult at all Somewhat difficult Not difficult at all  ? ? ? ? ?Past Medical History:  ?Diagnosis Date  ? Allergy   ? Anxiety   ? Headache   ? Hyperlipidemia   ? Migraine   ? Sleep apnea   ? ?Past Surgical History:  ?Procedure Laterality Date  ? ABDOMINAL HYSTERECTOMY    ? partial  ? [place]  ? BREAST BIOPSY Left 08/19/2020  ? Affirm bx-"X" clip-path pending  ? HERNIA REPAIR  09/13/2013  ? 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 ?Social History  ? ?Socioeconomic History  ? Marital status: Single  ?  Spouse name: Not on file  ? Number of children: Not on file  ? Years of education: high school  ? Highest education level: High school graduate  ?Occupational History  ? Not on file  ?Tobacco Use  ? Smoking status: Former  ?  Packs/day: 0.50  ?  Years: 40.00  ?  Pack years: 20.00  ?  Types: Cigarettes  ? Smokeless tobacco: Former  ?Vaping Use  ? Vaping Use: Never used  ?Substance and Sexual Activity  ? Alcohol use: Never  ? Drug use: Never  ? Sexual activity: Not on file  ?Other Topics Concern  ? Not on file  ?Social History Narrative  ? Not on file  ? ?Social Determinants of Health  ? ?Financial Resource Strain: Low Risk   ? Difficulty of Paying Living Expenses: Not hard at all  ?Food Insecurity: No Food Insecurity  ? Worried About Charity fundraiser in the Last Year: Never true  ? Ran Out of Food in the Last Year: Never true  ?Transportation  Needs: No Transportation Needs  ? Lack of Transportation (Medical): No  ? Lack of Transportation (Non-Medical): No  ?Physical Activity: Inactive  ? Days of Exercise per Week: 0 days  ? Minutes of Exercise per Session: 0 min  ?Stress: Stress Concern Present  ? Feeling of Stress : To some extent  ?Social Connections: Not on file  ?Intimate Partner Violence: Not on file  ? ?Family History  ?Problem Relation Age of Onset  ? Diabetes Mother   ? Heart disease Mother   ? Stroke Mother   ? CAD Mother   ? Diabetes Father   ? Diabetes Paternal Grandfather   ? ?Current Outpatient Medications on File Prior to Visit  ?Medication Sig  ? alendronate (FOSAMAX) 70 MG tablet TAKE 1 TABLET(70 MG) BY MOUTH 1 TIME A WEEK WITH A FULL GLASS OF WATER AND ON AN EMPTY STOMACH  ? amoxicillin (AMOXIL) 500 MG capsule Take 4 capsules (2,000 mg total) by mouth once as needed for up to 1 dose. Take before dentist apt  ? ascorbic acid (VITAMIN C) 500 MG tablet Take by mouth.  ? aspirin 81 MG EC tablet Take by mouth.  ? Cholecalciferol 25 MCG (1000 UT) tablet Take by mouth.  ? DULoxetine (CYMBALTA) 20 MG capsule Take 1 capsule (20 mg total) by mouth in the morning and at bedtime.  ? fluticasone (FLONASE) 50 MCG/ACT nasal spray Place 2 sprays into both nostrils daily.  ? gabapentin (NEURONTIN) 100 MG capsule Take 1-2 capsules (100-200 mg total) by mouth at bedtime as needed.  ? montelukast (SINGULAIR) 10 MG tablet Take 1 tablet (10 mg total) by mouth at bedtime.  ? rosuvastatin (CRESTOR) 20 MG tablet Take 1 tablet (20 mg total) by mouth at bedtime.  ? SUMAtriptan (IMITREX) 100 MG tablet Take 1 tablet (100 mg total) by mouth every 2 (two) hours as needed for migraine. May repeat in 2 hours if headache persists or recurs.  ? ?No current facility-administered medications on file prior to visit.  ? ? ?Review of Systems  ?Constitutional:  Negative for activity change, appetite change, chills, diaphoresis, fatigue and fever.  ?HENT:  Negative for  congestion and hearing loss.   ?Eyes:  Negative for visual disturbance.  ?Respiratory:  Negative for cough, chest tightness, shortness of breath and wheezing.   ?Cardiovascular:  Negative for chest pain, palpitations and leg swelling.  ?Gastrointestinal:  Negative for abdominal pain, constipation, diarrhea, nausea and vomiting.  ?Genitourinary:  Negative for dysuria, frequency and hematuria.  ?Musculoskeletal:  Negative for arthralgias and neck pain.  ?Skin:  Negative for rash.  ?Neurological:  Negative for dizziness, weakness, light-headedness, numbness and headaches.  ?Hematological:  Negative for adenopathy.  ?Psychiatric/Behavioral:  Negative for behavioral problems, dysphoric mood and sleep disturbance.   ?Per HPI unless specifically indicated above ? ?   ?Objective:  ?  ?BP 132/84   Pulse 75   Ht '5\' 1"'$  (1.549 m)   Wt 250 lb 12.8 oz (113.8 kg)   SpO2 94%   BMI 47.39 kg/m?   ?Wt Readings from Last 3 Encounters:  ?06/06/21 250 lb 12.8 oz (113.8 kg)  ?03/30/21 240 lb (108.9 kg)  ?08/24/20 233 lb 12.8 oz (106.1 kg)  ?  ?Physical Exam ?Vitals and nursing note reviewed.  ?Constitutional:   ?   General: She is not in acute distress. ?   Appearance: She is well-developed. She is not diaphoretic.  ?   Comments: Well-appearing, comfortable, cooperative  ?HENT:  ?   Head: Normocephalic and atraumatic.  ?Eyes:  ?   General:     ?   Right eye: No discharge.     ?   Left eye: No discharge.  ?   Conjunctiva/sclera: Conjunctivae normal.  ?   Pupils: Pupils are equal, round, and reactive to light.  ?Neck:  ?   Thyroid: No thyromegaly.  ?   Vascular: No carotid bruit.  ?Cardiovascular:  ?   Rate and Rhythm: Normal rate and regular rhythm.  ?   Pulses: Normal pulses.  ?   Heart sounds: Normal heart sounds. No murmur heard. ?Pulmonary:  ?   Effort: Pulmonary effort is normal. No respiratory distress.  ?   Breath sounds: Normal breath sounds. No wheezing or rales.  ?Abdominal:  ?   General: Bowel sounds are normal. There is no  distension.  ?   Palpations: Abdomen is soft. There is no mass.  ?   Tenderness: There is no abdominal tenderness.  ?Musculoskeletal:     ?   General: No tenderness. Normal range of motion.  ?   Cervical back: Normal

## 2021-07-07 ENCOUNTER — Encounter (INDEPENDENT_AMBULATORY_CARE_PROVIDER_SITE_OTHER): Payer: Self-pay | Admitting: Nurse Practitioner

## 2021-07-07 ENCOUNTER — Ambulatory Visit (INDEPENDENT_AMBULATORY_CARE_PROVIDER_SITE_OTHER): Payer: Medicare Other | Admitting: Nurse Practitioner

## 2021-07-07 VITALS — BP 123/80 | HR 82 | Resp 18 | Ht 61.0 in | Wt 249.0 lb

## 2021-07-07 DIAGNOSIS — M7989 Other specified soft tissue disorders: Secondary | ICD-10-CM | POA: Diagnosis not present

## 2021-07-07 DIAGNOSIS — M79606 Pain in leg, unspecified: Secondary | ICD-10-CM | POA: Diagnosis not present

## 2021-07-17 ENCOUNTER — Encounter (INDEPENDENT_AMBULATORY_CARE_PROVIDER_SITE_OTHER): Payer: Self-pay | Admitting: Nurse Practitioner

## 2021-07-17 NOTE — Progress Notes (Signed)
Subjective:    Patient ID: Diana Mcneil, female    DOB: 1945-11-07, 76 y.o.   MRN: [id number] Chief Complaint  Patient presents with   Establish Care    Referred by Dr Parks Ranger     Patient is seen for evaluation of leg pain and leg swelling. The patient first noticed the swelling remotely. The swelling is associated with pain and discoloration. The pain and swelling worsens with prolonged dependency and improves with elevation. The pain is unrelated to activity.  She notes numbness and tingling in her right leg when sitting for extended periods.  The patient notes that in the morning the legs are significantly improved but they steadily worsened throughout the course of the day. The patient also notes a steady worsening of the discoloration in the ankle and shin area.   The patient denies claudication symptoms.  The patient denies symptoms consistent with rest pain.  The patient has extensive degenerative disc disease the patient has no had any past angiography, interventions or vascular surgery.  Elevation makes the leg symptoms better, dependency makes them much worse. There is no history of ulcerations. The patient denies any recent changes in medications.  The patient has not been wearing graduated compression.  The patient denies a history of DVT or PE. There is no prior history of phlebitis. There is no history of primary lymphedema.  No history of malignancies. No history of trauma or groin or pelvic surgery. There is no history of radiation treatment to the groin or pelvis  The patient denies amaurosis fugax or recent TIA symptoms. There are no recent neurological changes noted. The patient denies recent episodes of angina or shortness of breath     Review of Systems  Cardiovascular:  Positive for leg swelling.  Musculoskeletal:  Positive for arthralgias.  Neurological:  Positive for weakness.  All other systems reviewed and are negative.      Objective:    Physical Exam Vitals reviewed.  HENT:     Head: Normocephalic.  Cardiovascular:     Rate and Rhythm: Normal rate.     Pulses: Decreased pulses.  Pulmonary:     Effort: Pulmonary effort is normal.  Skin:    General: Skin is warm and dry.  Neurological:     Mental Status: She is alert and oriented to person, place, and time.     Motor: Weakness present.     Gait: Gait abnormal.  Psychiatric:        Mood and Affect: Mood normal.        Behavior: Behavior normal.        Thought Content: Thought content normal.        Judgment: Judgment normal.     BP 123/80 (BP Location: Left Arm)   Pulse 82   Resp 18   Ht '5\' 1"'$  (1.549 m)   Wt 249 lb (112.9 kg)   BMI 47.05 kg/m   Past Medical History:  Diagnosis Date   Allergy    Anxiety    Headache    Hyperlipidemia    Migraine    Sleep apnea     Social History   Socioeconomic History   Marital status: Single    Spouse name: Not on file   Number of children: Not on file   Years of education: high school   Highest education level: High school graduate  Occupational History   Not on file  Tobacco Use   Smoking status: Former    Packs/day: 0.50  Years: 40.00    Total pack years: 20.00    Types: Cigarettes   Smokeless tobacco: Former  Scientific laboratory technician Use: Never used  Substance and Sexual Activity   Alcohol use: Never   Drug use: Never   Sexual activity: Not on file  Other Topics Concern   Not on file  Social History Narrative   Not on file   Social Determinants of Health   Financial Resource Strain: Low Risk  (07/20/2020)   Overall Financial Resource Strain (CARDIA)    Difficulty of Paying Living Expenses: Not hard at all  Food Insecurity: No Food Insecurity (07/20/2020)   Hunger Vital Sign    Worried About Running Out of Food in the Last Year: Never true    [place] in the Last Year: Never true  Transportation Needs: No Transportation Needs (07/20/2020)   PRAPARE - Radiographer, therapeutic (Medical): No    Lack of Transportation (Non-Medical): No  Physical Activity: Inactive (07/20/2020)   Exercise Vital Sign    Days of Exercise per Week: 0 days    Minutes of Exercise per Session: 0 min  Stress: Stress Concern Present (07/20/2020)   [place]    Feeling of Stress : To some extent  Social Connections: Not on file  Intimate Partner Violence: Not on file    Past Surgical History:  Procedure Laterality Date   ABDOMINAL HYSTERECTOMY     partial   BACK SURGERY  1989   BREAST BIOPSY Left 08/19/2020   Affirm bx-"X" clip-path pending   HERNIA REPAIR  09/13/2013   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    Family History  Problem Relation Age of Onset   Diabetes Mother    Heart disease Mother    Stroke Mother    CAD Mother    Diabetes Father    Diabetes Paternal Grandfather     Allergies  Allergen Reactions   Mushroom Extract Complex Swelling   [place] Diarrhea   Sulfa Antibiotics Hives and Swelling       Latest Ref Rng & Units 06/01/2021    8:06 AM 05/25/2020    9:25 AM  CBC  WBC 3.8 - 10.8 Thousand/uL 4.9  5.5   Hemoglobin 11.7 - 15.5 g/dL 13.1  14.0   Hematocrit 35.0 - 45.0 % 40.9  42.3   Platelets 140 - 400 Thousand/uL 156  167       CMP     Component Value Date/Time   NA 141 06/01/2021 0806   K 4.0 06/01/2021 0806   CL 104 06/01/2021 0806   CO2 31 06/01/2021 0806   GLUCOSE 84 06/01/2021 0806   BUN 20 06/01/2021 0806   CREATININE 0.68 06/01/2021 0806   CALCIUM 8.9 06/01/2021 0806   PROT 6.0 (L) 06/01/2021 0806   AST 20 06/01/2021 0806   ALT 13 06/01/2021 0806   BILITOT 0.7 06/01/2021 0806   GFRNONAA 77 05/25/2020 0925   GFRAA 90 05/25/2020 0925     No results found.     Assessment & Plan:   1. Pain and swelling of lower extremity, unspecified laterality Recommend:  I have had a long discussion with the patient regarding swelling and why it  causes  symptoms.  Patient will begin wearing graduated compression on a daily basis a prescription was given. The patient will  wear the stockings first thing in the morning and removing them in the evening. The  patient is instructed specifically not to sleep in the stockings.   In addition, behavioral modification will be initiated.  This will include frequent elevation, use of over the counter pain medications and exercise such as walking.  Consideration for a lymph pump will also be made based upon the effectiveness of conservative therapy.  This would help to improve the edema control and prevent sequela such as ulcers and infections   Patient should undergo duplex ultrasound of the venous system to ensure that DVT or reflux is not present.  Due to the numbness and tingling the patient gets we will also order ABIs.  The patient will follow-up with me after the ultrasound.     Current Outpatient Medications on File Prior to Visit  Medication Sig Dispense Refill   alendronate (FOSAMAX) 70 MG tablet TAKE 1 TABLET(70 MG) BY MOUTH 1 TIME A WEEK WITH A FULL GLASS OF WATER AND ON AN EMPTY STOMACH 12 tablet 3   amoxicillin (AMOXIL) 500 MG capsule Take 4 capsules (2,000 mg total) by mouth once as needed for up to 1 dose. Take before dentist apt 4 capsule 0   ascorbic acid (VITAMIN C) 500 MG tablet Take by mouth.     aspirin 81 MG EC tablet Take by mouth.     Cholecalciferol 25 MCG (1000 UT) tablet Take by mouth.     DULoxetine (CYMBALTA) 20 MG capsule Take 1 capsule (20 mg total) by mouth in the morning and at bedtime. 180 capsule 3   fluticasone (FLONASE) 50 MCG/ACT nasal spray Place 2 sprays into both nostrils daily. 48 g 3   gabapentin (NEURONTIN) 100 MG capsule Take 1-2 capsules (100-200 mg total) by mouth at bedtime as needed. 180 capsule 3   montelukast (SINGULAIR) 10 MG tablet Take 1 tablet (10 mg total) by mouth at bedtime. 90 tablet 3   pantoprazole (PROTONIX) 40 MG tablet Take 1 tablet (40 mg  total) by mouth daily before breakfast. 90 tablet 3   rosuvastatin (CRESTOR) 20 MG tablet Take 1 tablet (20 mg total) by mouth at bedtime. 90 tablet 3   SUMAtriptan (IMITREX) 100 MG tablet Take 1 tablet (100 mg total) by mouth every 2 (two) hours as needed for migraine. May repeat in 2 hours if headache persists or recurs. 10 tablet 5   No current facility-administered medications on file prior to visit.    There are no Patient Instructions on file for this visit. No follow-ups on file.   Kris Hartmann, NP

## 2021-07-25 ENCOUNTER — Ambulatory Visit: Payer: Medicare Other

## 2021-07-26 ENCOUNTER — Ambulatory Visit: Payer: Medicare Other

## 2021-08-01 ENCOUNTER — Ambulatory Visit: Payer: Medicare Other

## 2021-08-01 ENCOUNTER — Telehealth: Payer: Self-pay | Admitting: Family Medicine

## 2021-08-11 ENCOUNTER — Telehealth: Payer: Self-pay | Admitting: Family Medicine

## 2021-08-11 ENCOUNTER — Ambulatory Visit (INDEPENDENT_AMBULATORY_CARE_PROVIDER_SITE_OTHER): Payer: Medicare Other

## 2021-08-11 DIAGNOSIS — G43809 Other migraine, not intractable, without status migrainosus: Secondary | ICD-10-CM

## 2021-08-11 DIAGNOSIS — Z0000 Encounter for general adult medical examination without abnormal findings: Secondary | ICD-10-CM | POA: Diagnosis not present

## 2021-08-11 NOTE — Telephone Encounter (Signed)
Pt is calling to speak back to [place]. She said that the phone got d/c during appt. Please advise. [phone]

## 2021-08-11 NOTE — Patient Instructions (Signed)

## 2021-08-11 NOTE — Progress Notes (Signed)
Subjective:  I connected with  Diana Mcneil on 08/11/21 by a audio enabled telemedicine application and verified that I am speaking with the correct person using two identifiers.  Patient Location: Home  Provider Location: Office/Clinic  I discussed the limitations of evaluation and management by telemedicine. The patient expressed understanding and agreed to proceed.     Diana Mcneil is a 76 y.o. female who presents for Medicare Annual (Subsequent) preventive examination.  Review of Systems    Per HPI unless specifically indicated below        Objective:       07/07/2021    1:20 PM 06/06/2021    1:21 PM 03/30/2021    1:47 PM  Vitals with BMI  Height '5\' 1"'$  '5\' 1"'$  '5\' 1"'$   Weight 249 lbs 250 lbs 13 oz 240 lbs  BMI 47.07 62.70 35.00  Systolic 938 182 993  Diastolic 80 84 75  Pulse 82 75 69    There were no vitals filed for this visit. There is no height or weight on file to calculate BMI.     07/20/2020    1:29 PM  Advanced Directives  Does Patient Have a Medical Advance Directive? Yes  Type of Paramedic of [place];Living will  Copy of [place] in Chart? No - copy requested    Current Medications (verified) Outpatient Encounter Medications as of 08/11/2021  Medication Sig   alendronate (FOSAMAX) 70 MG tablet TAKE 1 TABLET(70 MG) BY MOUTH 1 TIME A WEEK WITH A FULL GLASS OF WATER AND ON AN EMPTY STOMACH   ascorbic acid (VITAMIN C) 500 MG tablet Take by mouth.   aspirin 81 MG EC tablet Take by mouth.   Cholecalciferol 25 MCG (1000 UT) tablet Take by mouth.   DULoxetine (CYMBALTA) 20 MG capsule Take 1 capsule (20 mg total) by mouth in the morning and at bedtime.   fluticasone (FLONASE) 50 MCG/ACT nasal spray Place 2 sprays into both nostrils daily.   gabapentin (NEURONTIN) 100 MG capsule Take 1-2 capsules (100-200 mg total) by mouth at bedtime as needed.   montelukast (SINGULAIR) 10 MG tablet Take 1 tablet (10 mg total) by  mouth at bedtime.   pantoprazole (PROTONIX) 40 MG tablet Take 1 tablet (40 mg total) by mouth daily before breakfast.   rosuvastatin (CRESTOR) 20 MG tablet Take 1 tablet (20 mg total) by mouth at bedtime.   SUMAtriptan (IMITREX) 100 MG tablet Take 1 tablet (100 mg total) by mouth every 2 (two) hours as needed for migraine. May repeat in 2 hours if headache persists or recurs.   amoxicillin (AMOXIL) 500 MG capsule Take 4 capsules (2,000 mg total) by mouth once as needed for up to 1 dose. Take before dentist apt (Patient not taking: Reported on 08/11/2021)   No facility-administered encounter medications on file as of 08/11/2021.    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   Family History  Problem Relation Age of Onset   Diabetes  Mother    Heart disease Mother    Stroke Mother    CAD Mother    Diabetes Father    Diabetes Paternal Grandfather    Social History   Socioeconomic History   Marital status: Single    Spouse name: Not on file   Number of children: Not on file   Years of education: high school   Highest education level: High school graduate  Occupational History   Not on file  Tobacco Use   Smoking status: Former    Packs/day: 0.50    Years: 40.00    Total pack years: 20.00    Types: Cigarettes   Smokeless tobacco: Former  Scientific laboratory technician Use: Never used  Substance and Sexual Activity   Alcohol use: Never   Drug use: Never   Sexual activity: Not on file  Other Topics Concern   Not on file  Social History Narrative   Not on file    Social Determinants of Health   Financial Resource Strain: Low Risk  (08/11/2021)   Overall Financial Resource Strain (CARDIA)    Difficulty of Paying Living Expenses: Not hard at all  Food Insecurity: No Food Insecurity (08/11/2021)   Hunger Vital Sign    Worried About Running Out of Food in the Last Year: Never true    [place] in the Last Year: Never true  Transportation Needs: No Transportation Needs (08/11/2021)   PRAPARE - Hydrologist (Medical): No    Lack of Transportation (Non-Medical): No  Physical Activity: Inactive (07/20/2020)   Exercise Vital Sign    Days of Exercise per Week: 0 days    Minutes of Exercise per Session: 0 min  Stress: Stress Concern Present (07/20/2020)   [place]    Feeling of Stress : To some extent  Social Connections: Socially Isolated (08/11/2021)   Social Connection and Isolation Panel [NHANES]    Frequency of Communication with Friends and Family: Once a week    Frequency of Social Gatherings with Friends and Family: Never    Attends Religious Services: Never    Printmaker: No    Attends Music therapist: Never    Marital Status: Living with partner    Tobacco Counseling Counseling given: Not Answered   Clinical Intake:  Pre-visit preparation completed: No  Pain : No/denies pain     Nutritional Status: BMI > 30  Obese Nutritional Risks: None Diabetes: No  How often do you need to have someone help you when you read instructions, pamphlets, or other written materials from your doctor or pharmacy?: 1 - Never  Diabetic?No   Interpreter Needed?: No  Information entered by :: Donnie Mesa, CMA   Activities of Daily Living    08/11/2021    3:01 PM  In your present state of health, do you have any difficulty performing the following activities:  Hearing? 0  Vision? 1  Difficulty  concentrating or making decisions? 0  Walking or climbing stairs? 1  Dressing or bathing? 0  Doing errands, shopping? 1    Patient Care Team: Olin Hauser, DO as PCP - General (Family Medicine) Kate Sable, MD as PCP - Cardiology (Cardiology)  Indicate any recent Medical Services you may have received from other than [facility] providers in the past year (date may be approximate).  No hospitalization in the past 12 months.    Assessment:  This is a routine wellness examination for [name].  Hearing/Vision screen No results found.  Dietary issues and exercise activities discussed: Current Exercise Habits: Structured exercise class, Type of exercise: walking, Time (Minutes): 10, Frequency (Times/Week): 3, Weekly Exercise (Minutes/Week): 30, Intensity: Mild   Goals Addressed   None    Depression Screen    08/11/2021    2:58 PM 06/06/2021    1:41 PM 03/30/2021    1:52 PM 08/24/2020    1:27 PM 07/20/2020    1:31 PM 05/25/2020   12:51 PM 03/19/2020   10:40 AM  PHQ 2/9 Scores  PHQ - 2 Score 0 0 0 0 0 0 1  PHQ- 9 Score '7 1 1 1  2 5    '$ Fall Risk    08/11/2021    2:58 PM 03/30/2021    1:52 PM 08/24/2020    1:27 PM 07/20/2020    1:30 PM 03/19/2020   10:29 AM  Fall Risk   Falls in the past year? 0 0 0 0 0  Number falls in past yr: 0 0 0  0  Injury with Fall? 0 0 0  0  Risk for fall due to : No Fall Risks Impaired mobility  Medication side effect   Follow up Falls evaluation completed Falls evaluation completed Falls evaluation completed Falls evaluation completed;Education provided;Falls prevention discussed     FALL RISK PREVENTION PERTAINING TO THE HOME:  Any stairs in or around the home? No  If so, are there any without handrails? No  Home free of loose throw rugs in walkways, pet beds, electrical cords, etc? No  Adequate lighting in your home to reduce risk of falls? Yes   ASSISTIVE DEVICES UTILIZED TO PREVENT FALLS:  Life alert? No  Use of a cane, walker or  w/c? Yes  Grab bars in the bathroom? Yes  Shower chair or bench in shower? Yes  Elevated toilet seat or a handicapped toilet? Yes   TIMED UP AND GO:  Was the test performed? No .  Virtual Telephone visit    Cognitive Function:        08/11/2021    3:03 PM 07/20/2020    1:34 PM  6CIT Screen  What Year? 0 points 0 points  What month? 0 points 0 points  What time? 0 points 0 points  Count back from 20 0 points 0 points  Months in reverse 0 points 0 points  Repeat phrase 0 points 4 points  Total Score 0 points 4 points    Immunizations Immunization History  Administered Date(s) Administered   Fluad Quad(high Dose 65+) 11/10/2019   Influenza Split 03/13/2016   Influenza, High Dose Seasonal PF 12/18/2016   Pneumococcal Conjugate-13 08/07/2018   Pneumococcal Polysaccharide-23 12/18/2016   Zoster Recombinat (Shingrix) 08/01/2019, 06/06/2021    TDAP status: Due, Education has been provided regarding the importance of this vaccine. Advised may receive this vaccine at local pharmacy or Health Dept. Aware to provide a copy of the vaccination record if obtained from local pharmacy or Health Dept. Verbalized acceptance and understanding.  Flu Vaccine status: Up to date  Pneumococcal vaccine status: Up to date  Covid-19 vaccine status: Declined, Education has been provided regarding the importance of this vaccine but patient still declined. Advised may receive this vaccine at local pharmacy or Health Dept.or vaccine clinic. Aware to provide a copy of the vaccination record if obtained from local pharmacy or Health Dept. Verbalized acceptance and understanding.  Qualifies for Shingles Vaccine? Yes  Zostavax completed Yes   Shingrix Completed?: Yes  Screening Tests Health Maintenance  Topic Date Due   COVID-19 Vaccine (1) Never done   TETANUS/TDAP  Never done   COLONOSCOPY (Pts 45-16yr Insurance coverage will need to be confirmed)  Never done   DEXA SCAN  Never done    INFLUENZA VACCINE  09/06/2021   Pneumonia Vaccine 76 Years old  Completed   Hepatitis C Screening  Completed   Zoster Vaccines- Shingrix  Completed   HPV VACCINES  Aged Out    Health Maintenance  Health Maintenance Due  Topic Date Due   COVID-19 Vaccine (1) Never done   TETANUS/TDAP  Never done   COLONOSCOPY (Pts 45-486yrInsurance coverage will need to be confirmed)  Never done   DEXA SCAN  Never done    Colorectal cancer screening: No longer required.  X [id number] Dr. MatAnne HahnRequest documentation of colonoscopy results.   Mammogram status: No longer required due to 08/19/2020.  Bone Density status: Ordered 06/06/2021. Pt provided with contact info and advised to call to schedule appt.  Lung Cancer Screening: (Low Dose CT Chest recommended if Age 42-28-80ars, 30 pack-year currently smoking OR have quit w/in 15years.) does not qualify.   Lung Cancer Screening Referral: does not qualify   Additional Screening:  Hepatitis C Screening: does qualify; Completed 05/25/2020   Vision Screening: Recommended annual ophthalmology exams for early detection of glaucoma and other disorders of the eye. Is the patient up to date with their annual eye exam?  Yes  Who is the provider or what is the name of the office in which the patient attends annual eye exams?  If pt is not established with a provider, would they like to be referred to a provider to establish care? No .   Dental Screening: Recommended annual dental exams for proper oral hygiene  Community Resource Referral / Chronic Care Management: CRR required this visit?  No   CCM required this visit?  No      Plan:     I have personally reviewed and noted the following in the patient's chart:   Medical and social history Use of alcohol, tobacco or illicit drugs  Current medications and supplements including opioid prescriptions.  Functional ability and status Nutritional status Physical activity Advanced  directives List of other physicians Hospitalizations, surgeries, and ER visits in previous 12 months Vitals Screenings to include cognitive, depression, and falls Referrals and appointments  In addition, I have reviewed and discussed with patient certain preventive protocols, quality metrics, and best practice recommendations. A written personalized care plan for preventive services as well as general preventive health recommendations were provided to patient.   Ms. BucKasterThank you for taking time to come for your Medicare Wellness Visit. I appreciate your ongoing commitment to your health goals. Please review the following plan we discussed and let me know if I can assist you in the future.   These are the goals we discussed:  Goals      Patient Stated     07/20/2020, wants to weigh 125-130 pounds        This is a list of the screening recommended for you and due dates:  Health Maintenance  Topic Date Due   COVID-19 Vaccine (1) Never done   Tetanus Vaccine  Never done   Colon Cancer Screening  Never done   DEXA scan (bone density measurement)  Never done   Flu Shot  09/06/2021   Pneumonia  Vaccine  Completed   Hepatitis C Screening: USPSTF Recommendation to screen - Ages 48-79 yo.  Completed   Zoster (Shingles) Vaccine  Completed   HPV Vaccine  Aged [address] [place]   08/11/2021

## 2021-08-11 NOTE — Telephone Encounter (Signed)
The pt requested a refill on her Sumatriptan 100 MG tablet. She would also like you to increase the quantity because she runs out of the medication before she is due for a refill. She reports usually two migraines a month lasting for several days.

## 2021-08-12 MED ORDER — SUMATRIPTAN SUCCINATE 100 MG PO TABS: 100.0000 mg | ORAL_TABLET | ORAL | 3 refills | Status: DC | PRN

## 2021-08-12 MED ORDER — SUMATRIPTAN SUCCINATE 100 MG PO TABS: 100.0000 mg | ORAL_TABLET | Freq: Once | ORAL | 3 refills | Status: DC | PRN

## 2021-08-12 NOTE — Telephone Encounter (Signed)
Okay I have attempted to re order at higher quantity  It will be 36 pills for 75-90 days  This med is under quantity limit restrictions. Unfortunately that is how it works because it is only for acute migraines short term. I did not choose the 10 pill count number, that is a pre set quantity.  I will see if the insurance allows the 36 count option.  Nobie Putnam, [facility] 08/12/2021, 5:06 PM

## 2021-08-16 ENCOUNTER — Other Ambulatory Visit (INDEPENDENT_AMBULATORY_CARE_PROVIDER_SITE_OTHER): Payer: Self-pay | Admitting: Nurse Practitioner

## 2021-08-16 DIAGNOSIS — M79606 Pain in leg, unspecified: Secondary | ICD-10-CM

## 2021-08-16 DIAGNOSIS — R200 Anesthesia of skin: Secondary | ICD-10-CM

## 2021-08-17 ENCOUNTER — Ambulatory Visit (INDEPENDENT_AMBULATORY_CARE_PROVIDER_SITE_OTHER): Payer: Medicare Other

## 2021-08-17 ENCOUNTER — Ambulatory Visit (INDEPENDENT_AMBULATORY_CARE_PROVIDER_SITE_OTHER): Payer: Medicare Other | Admitting: Nurse Practitioner

## 2021-08-17 ENCOUNTER — Encounter (INDEPENDENT_AMBULATORY_CARE_PROVIDER_SITE_OTHER): Payer: Self-pay | Admitting: Nurse Practitioner

## 2021-08-17 VITALS — BP 144/89 | HR 74 | Resp 16 | Wt 249.8 lb

## 2021-08-17 DIAGNOSIS — R202 Paresthesia of skin: Secondary | ICD-10-CM

## 2021-08-17 DIAGNOSIS — I890 Lymphedema, not elsewhere classified: Secondary | ICD-10-CM

## 2021-08-17 DIAGNOSIS — R200 Anesthesia of skin: Secondary | ICD-10-CM

## 2021-08-17 DIAGNOSIS — M7989 Other specified soft tissue disorders: Secondary | ICD-10-CM | POA: Diagnosis not present

## 2021-08-17 DIAGNOSIS — M79606 Pain in leg, unspecified: Secondary | ICD-10-CM | POA: Diagnosis not present

## 2021-08-17 DIAGNOSIS — M159 Polyosteoarthritis, unspecified: Secondary | ICD-10-CM | POA: Diagnosis not present

## 2021-09-03 NOTE — Progress Notes (Addendum)
Subjective:    Patient ID: Diana Mcneil, female    DOB: Apr 20, 1945, 76 y.o.   MRN: [id number] Chief Complaint  Patient presents with   Follow-up    Ultrasound follow up    The patient returns to the office for followup evaluation regarding leg swelling.  The swelling has persisted and the pain associated with swelling continues. There have not been any interval development of a ulcerations or wounds.  Since the previous visit the patient has been wearing graduated compression stockings and has noted little if any improvement in the lymphedema. The patient has been using compression routinely morning until night.  The patient also states elevation during the day and exercise is being done too.     Today noninvasive studies show no evidence of DVT or superficial thrombophlebitis bilaterally.  There is deep venous insufficiency on the right as well as a small amount of reflux in the great saphenous vein.  There is also anterior accessory great saphenous vein reflux.    Review of Systems  Cardiovascular:  Positive for leg swelling.  Musculoskeletal:  Positive for arthralgias.  Neurological:  Positive for weakness.  All other systems reviewed and are negative.      Objective:   Physical Exam Vitals reviewed.  HENT:     Head: Normocephalic.  Cardiovascular:     Rate and Rhythm: Normal rate.  Pulmonary:     Effort: Pulmonary effort is normal.  Musculoskeletal:     Right lower leg: Edema present.     Left lower leg: Edema present.  Skin:    General: Skin is warm and dry.     Comments: Dermal thickening bilaterally with hyperpigmentation  Neurological:     Mental Status: She is alert and oriented to person, place, and time.  Psychiatric:        Mood and Affect: Mood normal.        Behavior: Behavior normal.        Thought Content: Thought content normal.        Judgment: Judgment normal.     BP (!) 144/89 (BP Location: Right Arm)   Pulse 74   Resp 16   Wt 249 lb  12.8 oz (113.3 kg)   BMI 47.20 kg/m   Past Medical History:  Diagnosis Date   Allergy    Anxiety    Headache    Hyperlipidemia    Migraine    Sleep apnea     Social History   Socioeconomic History   Marital status: Single    Spouse name: Not on file   Number of children: Not on file   Years of education: high school   Highest education level: High school graduate  Occupational History   Not on file  Tobacco Use   Smoking status: Former    Packs/day: 0.50    Years: 40.00    Total pack years: 20.00    Types: Cigarettes   Smokeless tobacco: Former  Scientific laboratory technician Use: Never used  Substance and Sexual Activity   Alcohol use: Never   Drug use: Never   Sexual activity: Not on file  Other Topics Concern   Not on file  Social History Narrative   Not on file   Social Determinants of Health   Financial Resource Strain: Low Risk  (08/11/2021)   Overall Financial Resource Strain (CARDIA)    Difficulty of Paying Living Expenses: Not hard at all  Food Insecurity: No Food Insecurity (08/11/2021)   Hunger Vital  Sign    Worried About Charity fundraiser in the Last Year: Never true    [place] in the Last Year: Never true  Transportation Needs: No Transportation Needs (08/11/2021)   PRAPARE - Hydrologist (Medical): No    Lack of Transportation (Non-Medical): No  Physical Activity: Inactive (07/20/2020)   Exercise Vital Sign    Days of Exercise per Week: 0 days    Minutes of Exercise per Session: 0 min  Stress: Stress Concern Present (07/20/2020)   [place]    Feeling of Stress : To some extent  Social Connections: Socially Isolated (08/11/2021)   Social Connection and Isolation Panel [NHANES]    Frequency of Communication with Friends and Family: Once a week    Frequency of Social Gatherings with Friends and Family: Never    Attends Religious Services: Never    Building surveyor or Organizations: No    Attends Archivist Meetings: Never    Marital Status: Living with partner  Intimate Partner Violence: Not At Risk (08/11/2021)   Humiliation, Afraid, Rape, and Kick questionnaire    Fear of Current or Ex-Partner: No    Emotionally Abused: No    Physically Abused: No    Sexually Abused: No    Past Surgical History:  Procedure Laterality Date   ABDOMINAL HYSTERECTOMY     partial   BACK SURGERY  1989   BREAST BIOPSY Left 08/19/2020   Affirm bx-"X" clip-path pending   HERNIA REPAIR  09/13/2013   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    Family History  Problem Relation Age of Onset   Diabetes Mother    Heart disease Mother    Stroke Mother    CAD Mother    Diabetes Father    Diabetes Paternal Grandfather     Allergies  Allergen Reactions   Black Pepper-Turmeric    Mushroom Extract Complex Swelling   Sulfa Antibiotics Hives and Swelling       Latest Ref Rng & Units 06/01/2021    8:06 AM 05/25/2020    9:25 AM  CBC  WBC 3.8 - 10.8 Thousand/uL 4.9  5.5   Hemoglobin 11.7 - 15.5 g/dL 13.1  14.0   Hematocrit 35.0 - 45.0 % 40.9  42.3   Platelets 140 - 400 Thousand/uL 156  167       CMP     Component Value Date/Time   NA 141 06/01/2021 0806   K 4.0 06/01/2021 0806   CL 104 06/01/2021 0806   CO2 31 06/01/2021 0806   GLUCOSE 84 06/01/2021 0806   BUN 20 06/01/2021 0806   CREATININE 0.68 06/01/2021 0806   CALCIUM 8.9 06/01/2021 0806   PROT 6.0 (L) 06/01/2021 0806   AST 20 06/01/2021 0806   ALT 13 06/01/2021 0806   BILITOT 0.7 06/01/2021 0806   GFRNONAA 77 05/25/2020 0925   GFRAA 90 05/25/2020 0925     VAS [place] ABI WITH/WO TBI  Result Date: 08/18/2021  [facility] Patient Name:  ROMEKA Mcneil  Date of Exam:   08/17/2021 Medical Rec #: [id number]        Accession #:    [id number] Date of Birth: 06-26-1945        Patient Gender: F Patient Age:   45 years Exam Location:  [facility]  Procedure:      VAS [place] ABI WITH/WO TBI Referring Phys:  Dailyn Kempner --------------------------------------------------------------------------------  High Risk Factors: Hyperlipidemia, Diabetes, past history of smoking, coronary                    artery disease, prior CVA.  Performing Technologist: Delorise Shiner RVT  Examination Guidelines: A complete evaluation includes at minimum, Doppler waveform signals and systolic blood pressure reading at the level of bilateral brachial, anterior tibial, and posterior tibial arteries, when vessel segments are accessible. Bilateral testing is considered an integral part of a complete examination. Photoelectric Plethysmograph (PPG) waveforms and toe systolic pressure readings are included as required and additional duplex testing as needed. Limited examinations for reoccurring indications may be performed as noted.  ABI Findings: +---------+------------------+-----+---------+--------+ Right    Rt Pressure (mmHg)IndexWaveform Comment  +---------+------------------+-----+---------+--------+ Brachial 154                                      +---------+------------------+-----+---------+--------+ ATA      165               1.06 triphasic         +---------+------------------+-----+---------+--------+ PTA      172               1.10 triphasic         +---------+------------------+-----+---------+--------+ Great Toe143               0.92                   +---------+------------------+-----+---------+--------+ +---------+------------------+-----+---------+-------+ Left     Lt Pressure (mmHg)IndexWaveform Comment +---------+------------------+-----+---------+-------+ Brachial 156                                     +---------+------------------+-----+---------+-------+ ATA      176               1.13 triphasic        +---------+------------------+-----+---------+-------+ PTA      172               1.10 triphasic         +---------+------------------+-----+---------+-------+ Great Toe161               1.03                  +---------+------------------+-----+---------+-------+ +-------+-----------+-----------+------------+------------+ ABI/TBIToday's ABIToday's TBIPrevious ABIPrevious TBI +-------+-----------+-----------+------------+------------+ Right  1.10       0.92                                +-------+-----------+-----------+------------+------------+ Left   1.13       1.03                                +-------+-----------+-----------+------------+------------+   Summary: Right: Resting right ankle-brachial index is within normal range. No evidence of significant right lower extremity arterial disease. The right toe-brachial index is normal. Left: Resting left ankle-brachial index is within normal range. No evidence of significant left lower extremity arterial disease. The left toe-brachial index is normal. *See table(s) above for measurements and observations.  Electronically signed by Hortencia Pilar MD on 08/18/2021 at 5:35:44 PM.    Final        Assessment & Plan:   1. Primary osteoarthritis  involving multiple joints Continue NSAID medications as already ordered, these medications have been reviewed and there are no changes at this time.  Continued activity and therapy was stressed.   2. Lymphedema Recommend:  No surgery or intervention at this point in time.    I have reviewed my discussion with the patient regarding lymphedema and why it  causes symptoms.  Patient will continue wearing graduated compression on a daily basis. The patient should put the compression on first thing in the morning and removing them in the evening. The patient should not sleep in the compression.   In addition, behavioral modification throughout the day will be continued.  This will include frequent elevation (such as in a recliner), use of over the counter pain medications as needed and exercise  such as walking.  The systemic causes for chronic edema such as liver, kidney and cardiac etiologies do not appear to have significant changed over the past year.    Despite conservative treatments, of at least 4 weeks, including graduated compression therapy class 1 and behavioral modification including exercise and elevation the patient  has not obtained adequate control of the lymphedema.  The patient still has stage 3 lymphedema and therefore, I believe that a lymph pump should be added to improve the control of the patient's lymphedema.  Additionally, a lymph pump is warranted because it will reduce the risk of cellulitis and ulceration in the future.  Patient should follow-up in six months       Current Outpatient Medications on File Prior to Visit  Medication Sig Dispense Refill   alendronate (FOSAMAX) 70 MG tablet TAKE 1 TABLET(70 MG) BY MOUTH 1 TIME A WEEK WITH A FULL GLASS OF WATER AND ON AN EMPTY STOMACH 12 tablet 3   ascorbic acid (VITAMIN C) 500 MG tablet Take by mouth.     aspirin 81 MG EC tablet Take by mouth.     Cholecalciferol 25 MCG (1000 UT) tablet Take by mouth.     DULoxetine (CYMBALTA) 20 MG capsule Take 1 capsule (20 mg total) by mouth in the morning and at bedtime. 180 capsule 3   fluticasone (FLONASE) 50 MCG/ACT nasal spray Place 2 sprays into both nostrils daily. 48 g 3   gabapentin (NEURONTIN) 100 MG capsule Take 1-2 capsules (100-200 mg total) by mouth at bedtime as needed. 180 capsule 3   montelukast (SINGULAIR) 10 MG tablet Take 1 tablet (10 mg total) by mouth at bedtime. 90 tablet 3   pantoprazole (PROTONIX) 40 MG tablet Take 1 tablet (40 mg total) by mouth daily before breakfast. 90 tablet 3   rosuvastatin (CRESTOR) 20 MG tablet Take 1 tablet (20 mg total) by mouth at bedtime. 90 tablet 3   SUMAtriptan (IMITREX) 100 MG tablet Take 1 tablet (100 mg total) by mouth once as needed for migraine. May repeat one dose in 2 hours if headache persists, for max dose 24  hours 36 tablet 3   amoxicillin (AMOXIL) 500 MG capsule Take 4 capsules (2,000 mg total) by mouth once as needed for up to 1 dose. Take before dentist apt (Patient not taking: Reported on 08/11/2021) 4 capsule 0   No current facility-administered medications on file prior to visit.    There are no Patient Instructions on file for this visit. No follow-ups on file.   Kris Hartmann, NP

## 2021-09-04 ENCOUNTER — Encounter (INDEPENDENT_AMBULATORY_CARE_PROVIDER_SITE_OTHER): Payer: Self-pay | Admitting: Nurse Practitioner

## 2021-11-03 ENCOUNTER — Other Ambulatory Visit: Payer: Self-pay | Admitting: Family Medicine

## 2021-11-03 DIAGNOSIS — M810 Age-related osteoporosis without current pathological fracture: Secondary | ICD-10-CM

## 2021-11-03 NOTE — Telephone Encounter (Signed)
Requested medications are due for refill today.  Too soon  Requested medications are on the active medications list.  yes  Last refill. 01/11/2021 #12 3 rf  Future visit scheduled.   yes  Notes to clinic.  Missing/ expired labs. Refill not due.    Requested Prescriptions  Pending Prescriptions Disp Refills   alendronate (FOSAMAX) 70 MG tablet [Pharmacy Med Name: Alendronate Sodium 70 MG Oral Tablet] 12 tablet 3    Sig: TAKE 1 TABLET BY MOUTH WEEKLY  WITH 8 OZ OF PLAIN WATER 30  MINUTES BEFORE FIRST FOOD, DRINK OR MEDS. STAY UPRIGHT FOR 30  MINS     Endocrinology:  Bisphosphonates Failed - 11/03/2021  8:40 AM      Failed - Vitamin D in normal range and within 360 days    No results found for: "AY3016WF0", "XN2355DD2", "VD125OH2TOT", "25OHVITD3", "25OHVITD2", "25OHVITD1", "VD25OH"       Failed - Mg Level in normal range and within 360 days    No results found for: "MG"       Failed - Phosphate in normal range and within 360 days    No results found for: "PHOS"       Failed - Bone Mineral Density or Dexa Scan completed in the last 2 years      Passed - Ca in normal range and within 360 days    Calcium  Date Value Ref Range Status  06/01/2021 8.9 8.6 - 10.4 mg/dL Final         Passed - Cr in normal range and within 360 days    Creat  Date Value Ref Range Status  06/01/2021 0.68 0.60 - 1.00 mg/dL Final         Passed - eGFR is 30 or above and within 360 days    GFR, Est African American  Date Value Ref Range Status  05/25/2020 90 > OR = 60 mL/min/1.52m Final   GFR, Est Non African American  Date Value Ref Range Status  05/25/2020 77 > OR = 60 mL/min/1.774mFinal   eGFR  Date Value Ref Range Status  06/01/2021 91 > OR = 60 mL/min/1.7334minal    Comment:    The eGFR is based on the CKD-EPI 2021 equation. To calculate  the new eGFR from a previous Creatinine or Cystatin C result, go to https://www.kidney.org/professionals/ kdoqi/gfr%5Fcalculator          Passed -  Valid encounter within last 12 months    Recent Outpatient Visits           5 months ago Annual physical exam   [place]   7 months ago [facility], recurrent[facility]   1 year ago Morbid obesity with BMI of 40.0-44.9, [facility]   1 year ago Annual physical exam   [place]   1 year ago [facility], recurrent, [facility]       Future Appointments             In 7 months [facility]

## 2021-11-07 ENCOUNTER — Other Ambulatory Visit: Payer: Self-pay | Admitting: Family Medicine

## 2021-11-07 DIAGNOSIS — G8929 Other chronic pain: Secondary | ICD-10-CM

## 2021-11-07 DIAGNOSIS — M1711 Unilateral primary osteoarthritis, right knee: Secondary | ICD-10-CM

## 2021-11-07 NOTE — Telephone Encounter (Signed)
Requested Prescriptions  Pending Prescriptions Disp Refills  . gabapentin (NEURONTIN) 100 MG capsule [Pharmacy Med Name: Gabapentin 100 MG Oral Capsule] 180 capsule 0    Sig: TAKE 1 TO 2 CAPSULES BY MOUTH AT BEDTIME AS NEEDED     Neurology: Anticonvulsants - gabapentin Passed - 11/07/2021  8:57 AM      Passed - Cr in normal range and within 360 days    Creat  Date Value Ref Range Status  06/01/2021 0.68 0.60 - 1.00 mg/dL Final         Passed - Completed PHQ-2 or PHQ-9 in the last 360 days      Passed - Valid encounter within last 12 months    Recent Outpatient Visits          5 months ago Annual physical exam   [place], DO   7 months ago Major depression, recurrent, full remission [facility])   [place], DO   1 year ago Morbid obesity with BMI of 40.0-44.9, adult [facility])   [facility] Olin Hauser, DO   1 year ago Annual physical exam   [place], DO   1 year ago Major depression, recurrent, full remission [facility])   [facility] [place], Devonne Doughty, DO      Future Appointments            In 7 months [place], Devonne Doughty, DO [facility], [facility]

## 2021-11-10 ENCOUNTER — Other Ambulatory Visit: Payer: Self-pay | Admitting: Family Medicine

## 2021-11-10 DIAGNOSIS — J3089 Other allergic rhinitis: Secondary | ICD-10-CM

## 2021-11-10 NOTE — Telephone Encounter (Signed)
Original Rx- 03/30/21 #90 3RF- request for change to #100 for best Rx benefit OK #100 for remainder of Rx Requested Prescriptions  Pending Prescriptions Disp Refills  . montelukast (SINGULAIR) 10 MG tablet [Pharmacy Med Name: Montelukast Sodium 10 MG Oral Tablet] 100 tablet 2    Sig: TAKE 1 TABLET BY MOUTH AT  BEDTIME     Pulmonology:  Leukotriene Inhibitors Passed - 11/10/2021  8:34 AM      Passed - Valid encounter within last 12 months    Recent Outpatient Visits          5 months ago Annual physical exam   [place], DO   7 months ago Major depression, recurrent, full remission [facility])   [place], DO   1 year ago Morbid obesity with BMI of 40.0-44.9, adult [facility])   [facility] Olin Hauser, DO   1 year ago Annual physical exam   [place], DO   1 year ago Major depression, recurrent, full remission [facility])   [facility] Parks Ranger, Devonne Doughty, DO      Future Appointments            In 7 months [place] Ranger, Devonne Doughty, DO [facility], [facility]

## 2021-11-13 ENCOUNTER — Other Ambulatory Visit: Payer: Self-pay | Admitting: Family Medicine

## 2021-11-13 DIAGNOSIS — E785 Hyperlipidemia, unspecified: Secondary | ICD-10-CM

## 2021-11-14 NOTE — Telephone Encounter (Signed)
Requested Prescriptions  Pending Prescriptions Disp Refills  . rosuvastatin (CRESTOR) 20 MG tablet [Pharmacy Med Name: Rosuvastatin Calcium 20 MG Oral Tablet] 90 tablet 2    Sig: TAKE 1 TABLET BY MOUTH AT  BEDTIME     Cardiovascular:  Antilipid - Statins 2 Failed - 11/13/2021  2:45 PM      Failed - Lipid Panel in normal range within the last 12 months    Cholesterol  Date Value Ref Range Status  06/01/2021 160 <200 mg/dL Final   LDL Cholesterol (Calc)  Date Value Ref Range Status  06/01/2021 68 mg/dL (calc) Final    Comment:    Reference range: <100 . Desirable range <100 mg/dL for primary prevention;   <70 mg/dL for patients with CHD or diabetic patients  with > or = 2 CHD risk factors. Marland Kitchen LDL-C is now calculated using the Martin-Hopkins  calculation, which is a validated novel method providing  better accuracy than the Friedewald equation in the  estimation of LDL-C.  Cresenciano Genre et al. Annamaria Helling. 0223;361(22): 2061-2068  (http://education.[employer].com/faq/FAQ164)    HDL  Date Value Ref Range Status  06/01/2021 76 > OR = 50 mg/dL Final   Triglycerides  Date Value Ref Range Status  06/01/2021 75 <150 mg/dL Final         Passed - Cr in normal range and within 360 days    Creat  Date Value Ref Range Status  06/01/2021 0.68 0.60 - 1.00 mg/dL Final         Passed - Patient is not pregnant      Passed - Valid encounter within last 12 months    Recent Outpatient Visits          5 months ago Annual physical exam   [place], DO   7 months ago Major depression, recurrent, full remission [facility])   [place], DO   1 year ago Morbid obesity with BMI of 40.0-44.9, adult [facility])   [facility] Olin Hauser, DO   1 year ago Annual physical exam   [place], DO   1 year ago Major depression, recurrent, full remission [facility])    [facility] Parks [place], Devonne Doughty, DO      Future Appointments            In 7 months [place], Devonne Doughty, DO [facility], [facility]

## 2021-12-21 ENCOUNTER — Other Ambulatory Visit: Payer: Self-pay

## 2021-12-21 DIAGNOSIS — G43809 Other migraine, not intractable, without status migrainosus: Secondary | ICD-10-CM

## 2021-12-21 DIAGNOSIS — J0110 Acute frontal sinusitis, unspecified: Secondary | ICD-10-CM

## 2021-12-21 MED ORDER — SUMATRIPTAN SUCCINATE 100 MG PO TABS: 100.0000 mg | ORAL_TABLET | Freq: Once | ORAL | 3 refills | Status: DC | PRN

## 2021-12-21 MED ORDER — FLUTICASONE PROPIONATE 50 MCG/ACT NA SUSP: 2.0000 | Freq: Every day | NASAL | 3 refills | Status: DC

## 2022-01-16 ENCOUNTER — Encounter: Payer: Self-pay | Admitting: Family Medicine

## 2022-01-16 ENCOUNTER — Ambulatory Visit (INDEPENDENT_AMBULATORY_CARE_PROVIDER_SITE_OTHER): Payer: Medicare Other | Admitting: Family Medicine

## 2022-01-16 VITALS — BP 138/78 | HR 75 | Ht 61.0 in | Wt 252.0 lb

## 2022-01-16 DIAGNOSIS — L821 Other seborrheic keratosis: Secondary | ICD-10-CM | POA: Diagnosis not present

## 2022-01-16 DIAGNOSIS — J3089 Other allergic rhinitis: Secondary | ICD-10-CM | POA: Diagnosis not present

## 2022-01-16 DIAGNOSIS — B372 Candidiasis of skin and nail: Secondary | ICD-10-CM

## 2022-01-16 DIAGNOSIS — Z23 Encounter for immunization: Secondary | ICD-10-CM | POA: Diagnosis not present

## 2022-01-16 MED ORDER — AZELASTINE HCL 0.15 % NA SOLN: 2.0000 | Freq: Two times a day (BID) | NASAL | 2 refills | Status: DC

## 2022-01-16 MED ORDER — TRIAMCINOLONE ACETONIDE 0.5 % EX OINT: 1.0000 | TOPICAL_OINTMENT | Freq: Two times a day (BID) | CUTANEOUS | 0 refills | Status: AC

## 2022-01-16 MED ORDER — NYSTATIN 100000 UNIT/GM EX POWD: 1.0000 | Freq: Three times a day (TID) | CUTANEOUS | 2 refills | Status: DC

## 2022-01-16 NOTE — Progress Notes (Signed)
Subjective:    Patient ID: Diana Mcneil, female    DOB: 25-Jan-1946, 76 y.o.   MRN: [id number]  Diana Mcneil is a 76 y.o. female presenting on 01/16/2022 for Rash  Patient presents for a same day appointment.  HPI  Left sided Intertrigo Rash Reports 4-6 months episodic flare, coming and going with redness irritation rash under skin fold L breast only. Used drying topical powder. No medication. Worse in summer  SKs multiple, inflamed Large mole like growths on back reported worse over past several months, but present for a while. Asking about removal / dermatology  Sinusitis, allergic Recurrent sore throat, persistent post nasal drainage and throat irritation, hoarse voice On Allergy meds, Flonase  Health Maintenance: Flu Shot due today     01/16/2022    4:34 PM 08/11/2021    2:58 PM 06/06/2021    1:41 PM  Depression screen PHQ 2/9  Decreased Interest 1 0 0  Down, Depressed, Hopeless 1 0 0  PHQ - 2 Score 2 0 0  Altered sleeping 1 3 0  Tired, decreased energy _0 Change in appetite 1 1 0  Feeling bad or failure about yourself  0 0 0  Trouble concentrating 0 0 0  Moving slowly or fidgety/restless 3 0 0  Suicidal thoughts 0 0 0  PHQ-9 Score _1 Difficult doing work/chores Extremely dIfficult Not difficult at all Not difficult at all    Social History   Tobacco Use   Smoking status: Former    Packs/day: 0.50    Years: 40.00    Total pack years: 20.00    Types: Cigarettes   Smokeless tobacco: Former  Scientific laboratory technician Use: Never used  Substance Use Topics   Alcohol use: Never   Drug use: Never    Review of Systems Per HPI unless specifically indicated above     Objective:    BP (!) 142/77   Pulse 75   Ht _2  (1.549 m)   Wt 252 lb (114.3 kg)   SpO2 95%   BMI 47.61 kg/m   Wt Readings from Last 3 Encounters:  01/16/22 252 lb (114.3 kg)  08/17/21 249 lb 12.8 oz (113.3 kg)  07/07/21 249 lb (112.9 kg)    Physical Exam Vitals and nursing  note reviewed.  Constitutional:      General: She is not in acute distress.    Appearance: Normal appearance. She is well-developed. She is not diaphoretic.     Comments: Well-appearing, comfortable, cooperative  HENT:     Head: Normocephalic and atraumatic.  Eyes:     General:        Right eye: No discharge.        Left eye: No discharge.     Conjunctiva/sclera: Conjunctivae normal.  Cardiovascular:     Rate and Rhythm: Normal rate.  Pulmonary:     Effort: Pulmonary effort is normal.  Skin:    General: Skin is warm and dry.     Findings: Rash (Left chest wall under breast in skin fold) present. No erythema.  Neurological:     Mental Status: She is alert and oriented to person, place, and time.  Psychiatric:        Mood and Affect: Mood normal.        Behavior: Behavior normal.        Thought Content: Thought content normal.     Comments: Well groomed, good eye contact, normal speech and thoughts  Results for orders placed or performed in visit on 06/01/21  TSH  Result Value Ref Range   TSH 3.10 0.40 - 4.50 mIU/L  Hemoglobin A1c  Result Value Ref Range   Hgb A1c MFr Bld 5.2 <5.7 % of total Hgb   Mean Plasma Glucose 103 mg/dL   eAG (mmol/L) 5.7 mmol/L  CBC with Differential/Platelet  Result Value Ref Range   WBC 4.9 3.8 - 10.8 Thousand/uL   RBC 4.32 3.80 - 5.10 Million/uL   Hemoglobin 13.1 11.7 - 15.5 g/dL   HCT 40.9 35.0 - 45.0 %   MCV 94.7 80.0 - 100.0 fL   MCH 30.3 27.0 - 33.0 pg   MCHC 32.0 32.0 - 36.0 g/dL   RDW 12.4 11.0 - 15.0 %   Platelets 156 140 - 400 Thousand/uL   MPV 11.4 7.5 - 12.5 fL   Neutro Abs 2,470 1,500 - 7,800 cells/uL   Lymphs Abs 1,784 850 - 3,900 cells/uL   Absolute Monocytes 446 200 - 950 cells/uL   Eosinophils Absolute 172 15 - 500 cells/uL   Basophils Absolute 29 0 - 200 cells/uL   Neutrophils Relative % 50.4 %   Total Lymphocyte 36.4 %   Monocytes Relative 9.1 %   Eosinophils Relative 3.5 %   Basophils Relative 0.6 %  Lipid  panel  Result Value Ref Range   Cholesterol 160 <200 mg/dL   HDL 76 > OR = 50 mg/dL   Triglycerides 75 <150 mg/dL   LDL Cholesterol (Calc) 68 mg/dL (calc)   Total CHOL/HDL Ratio 2.1 <5.0 (calc)   Non-HDL Cholesterol (Calc) 84 <130 mg/dL (calc)  COMPLETE METABOLIC PANEL WITH GFR  Result Value Ref Range   Glucose, Bld 84 65 - 99 mg/dL   BUN 20 7 - 25 mg/dL   Creat 0.68 0.60 - 1.00 mg/dL   eGFR 91 > OR = 60 mL/min/1.65m   BUN/Creatinine Ratio NOT APPLICABLE 6 - 22 (calc)   Sodium 141 135 - 146 mmol/L   Potassium 4.0 3.5 - 5.3 mmol/L   Chloride 104 98 - 110 mmol/L   CO2 31 20 - 32 mmol/L   Calcium 8.9 8.6 - 10.4 mg/dL   Total Protein 6.0 (L) 6.1 - 8.1 g/dL   Albumin 3.8 3.6 - 5.1 g/dL   Globulin 2.2 1.9 - 3.7 g/dL (calc)   AG Ratio 1.7 1.0 - 2.5 (calc)   Total Bilirubin 0.7 0.2 - 1.2 mg/dL   Alkaline phosphatase (APISO) 82 37 - 153 U/L   AST 20 10 - 35 U/L   ALT 13 6 - 29 U/L      Assessment & Plan:   Problem List Items Addressed This Visit   None Visit Diagnoses     Candidal intertrigo    -  Primary   Relevant Medications   nystatin (MYCOSTATIN/NYSTOP) powder   triamcinolone ointment (KENALOG) 0.5 %   Needs flu shot       Relevant Orders   Flu Vaccine QUAD High Dose(Fluad)   SK (seborrheic keratosis)       Relevant Orders   Ambulatory referral to Dermatology   Seasonal allergic rhinitis due to other allergic trigger       Relevant Medications   Azelastine HCl 0.15 % SOLN       subacute on chronic flare >6 months episodic, LEFT skin fold under breast Mild to moderate appearing candidal intertrigo characteristic on exam.  Prior similar episodes years ago due to excessive sweating / moisture.  Plan: 1. Start  Nystatin powder TID for 2-4 weeks 2. Also given rx Triamcinolone 0.5% ointment 1-2x daily for up to 2 weeks (may use in few days to reduce inflammation if needed) Keep dry, open to air some, limit bra, avoid excessive sweating Follow-up 2-3 weeks if not  improved, consider alternative anti fungal topical   #Sinusitis Add Azelastine nasal spray to her regimen. May keep Flonase if helpful Continue oral anti histamine  #SK's multiple large inflamed on back Referral to Dermatology given large extensive SKs symptomatic inflamed, request cryotherapy or removal.  Orders Placed This Encounter  Procedures   Flu Vaccine QUAD High Dose(Fluad)   Ambulatory referral to Dermatology    Referral Priority:   Routine    Referral Type:   Consultation    Referral Reason:   Specialty Services Required    Requested Specialty:   Dermatology    Number of Visits Requested:   1     Meds ordered this encounter  Medications   nystatin (MYCOSTATIN/NYSTOP) powder    Sig: Apply 1 Application topically 3 (three) times daily. For 2-4 weeks. May repeat if needed.    Dispense:  30 g    Refill:  2   triamcinolone ointment (KENALOG) 0.5 %    Sig: Apply 1 Application topically 2 (two) times daily. For 1-2 weeks affected area only then stop.    Dispense:  30 g    Refill:  0   Azelastine HCl 0.15 % SOLN    Sig: Place 2 sprays into the nose in the morning and at bedtime.    Dispense:  30 mL    Refill:  2      Follow up plan: Return if symptoms worsen or fail to improve.   Nobie Putnam, [facility] 01/16/2022, 4:31 PM

## 2022-01-16 NOTE — Patient Instructions (Addendum)
Thank you for coming to the office today.  Try Azelastine nasal spray 2 sprays twice a day for allergies sinus  Referral sent. Stay tuned for updates.  [address]   [place], [place] [address] Hours: 8AM-5PM Phone: [phone]  ----------------------------------------  Use Nystatin powder under L breast 3 times a day for 2-4 weeks or until resolved.  May use the ointment Triamcinolone as needed for spot treatment for red irritated inflammation  Intertrigo Intertrigo is skin irritation (inflammation) that happens in warm, moist areas of the body. The irritation can cause a rash and make skin raw and itchy. The rash is usually pink or red. It happens mostly between folds of skin or where skin rubs together, such as: In the armpits. Under the breasts. Under the belly. In the groin area. Around the butt area. Between the toes. This condition is not passed from person to person. What are the causes? Heat, moisture, rubbing, and not enough air movement. The condition can be made worse by: Sweat. Bacteria. A fungus, such as yeast. What increases the risk? Moisture in your skin folds. You are more likely to develop this condition if you: Are not able to move around. Live in a warm and moist climate. Are not able to control your pee (urine) or poop (stool). Wear splints, braces, or other medical devices. Are overweight. Have diabetes. What are the signs or symptoms? A pink or red skin rash in a skin fold or near a skin fold. Raw or scaly skin. Itching. A burning feeling. Bleeding. Leaking fluid. A bad smell. How is this treated? Cleaning and drying your skin. Taking an antibiotic medicine or using an antibiotic skin cream for a bacterial infection. Using an antifungal cream on your skin or taking pills for an infection that was caused by a fungus, such as yeast. Using a steroid ointment to stop the itching and  irritation. Separating the skin fold with a clean cotton cloth to absorb moisture and allow air to flow into the area. Follow these instructions at home: Keep the affected area clean and dry. Do not scratch your skin. Stay cool as much as you can. Use an air conditioner or a fan, if you have one. Apply over-the-counter and prescription medicines only as told by your doctor. If you were prescribed antibiotics, use them as told by your doctor. Do not stop using the antibiotic even if you start to feel better. Keep all follow-up visits. Your doctor may need to check your skin to make sure that the treatment is working. How is this prevented? Shower and dry yourself well after being active. Use a hair dryer on a cool setting to dry between skin folds. Do not wear tight clothes. Wear clothes that: Are loose. Take moisture away from your body. Are made of cotton. Wear a bra that gives good support, if needed. Protect the skin in your groin and butt area as told by your doctor. To do this: Follow a regular cleaning routine. Use creams, powders, or ointments that protect your skin. Change protection pads often. Stay at a healthy weight. Take care of your feet. This is very important if you have diabetes. You should: Wear shoes that fit well. Keep your feet dry. Wear clean cotton or wool socks. Keep your blood sugar under control if you have diabetes. Contact a doctor if: Your symptoms do not get better with treatment. Your symptoms get worse or they spread. You notice more  redness and warmth. You have a fever. This information is not intended to replace advice given to you by your health care provider. Make sure you discuss any questions you have with your health care provider. Document Revised: 06/16/2021 Document Reviewed: 06/16/2021 Elsevier Patient Education  [place].    Please schedule a Follow-up Appointment to: Return if symptoms worsen or fail to improve.  If you  have any other questions or concerns, please feel free to call the office or send a message through [place]. You may also schedule an earlier appointment if necessary.  Additionally, you may be receiving a survey about your experience at our office within a few days to 1 week by e-mail or mail. We value your feedback.  Nobie Putnam, DO [place]

## 2022-01-17 ENCOUNTER — Ambulatory Visit: Payer: Medicare Other

## 2022-01-26 DIAGNOSIS — I890 Lymphedema, not elsewhere classified: Secondary | ICD-10-CM | POA: Diagnosis not present

## 2022-02-19 ENCOUNTER — Other Ambulatory Visit: Payer: Self-pay | Admitting: Family Medicine

## 2022-02-19 DIAGNOSIS — F3342 Major depressive disorder, recurrent, in full remission: Secondary | ICD-10-CM

## 2022-02-20 ENCOUNTER — Ambulatory Visit (INDEPENDENT_AMBULATORY_CARE_PROVIDER_SITE_OTHER): Payer: 59 | Admitting: Vascular Surgery

## 2022-02-20 NOTE — Telephone Encounter (Signed)
Requested Prescriptions  Pending Prescriptions Disp Refills   DULoxetine (CYMBALTA) 20 MG capsule [Pharmacy Med Name: DULoxetine HCl 20 MG Oral Capsule Delayed Release Particles] 180 capsule 3    Sig: TAKE 1 CAPSULE BY MOUTH IN THE  MORNING AND AT BEDTIME     Psychiatry: Antidepressants - SNRI - duloxetine Passed - 02/19/2022  8:40 AM      Passed - Cr in normal range and within 360 days    Creat  Date Value Ref Range Status  06/01/2021 0.68 0.60 - 1.00 mg/dL Final         Passed - eGFR is 30 or above and within 360 days    GFR, Est African American  Date Value Ref Range Status  05/25/2020 90 > OR = 60 mL/min/1.22m Final   GFR, Est Non African American  Date Value Ref Range Status  05/25/2020 77 > OR = 60 mL/min/1.796mFinal   eGFR  Date Value Ref Range Status  06/01/2021 91 > OR = 60 mL/min/1.732minal    Comment:    The eGFR is based on the CKD-EPI 2021 equation. To calculate  the new eGFR from a previous Creatinine or Cystatin C result, go to https://www.kidney.org/professionals/ kdoqi/gfr%5Fcalculator          Passed - Completed PHQ-2 or PHQ-9 in the last 360 days      Passed - Last BP in normal range    BP Readings from Last 1 Encounters:  01/16/22 138/78         Passed - Valid encounter within last 6 months    Recent Outpatient Visits           1 month ago Candidal intertrigo   [place]   8 months ago Annual physical exam   [place]   10 months ago [facility], [facility]   1 year ago [facility]bid [facility] with BMI [facility] 40.0-44.9[facility]   1 year ago Annual physical exam   [place]       Future Appointments             In 3 months [facility] 9 months [place] [place]

## 2022-02-26 ENCOUNTER — Other Ambulatory Visit: Payer: Self-pay | Admitting: Family Medicine

## 2022-02-26 DIAGNOSIS — K219 Gastro-esophageal reflux disease without esophagitis: Secondary | ICD-10-CM

## 2022-02-27 NOTE — Telephone Encounter (Signed)
Unable to refill per protocol, Rx request is too soon. Last refill 06/06/21 for 90 and 3 refills.  Requested Prescriptions  Pending Prescriptions Disp Refills   pantoprazole (PROTONIX) 40 MG tablet [Pharmacy Med Name: Pantoprazole Sodium 40 MG Oral Tablet Delayed Release] 100 tablet 2    Sig: TAKE 1 TABLET BY MOUTH DAILY  BEFORE BREAKFAST     Gastroenterology: Proton Pump Inhibitors Passed - 02/26/2022  8:48 AM      Passed - Valid encounter within last 12 months    Recent Outpatient Visits           1 month ago Candidal intertrigo   [place], DO   8 months ago Annual physical exam   [facility] Olin Hauser, DO   11 months ago Major depression, recurrent, full remission [facility])   [place], DO   1 year ago Morbid obesity with BMI of 40.0-44.9, adult [facility])   [facility] Olin Hauser, DO   1 year ago Annual physical exam   [facility] Olin Hauser, DO       Future Appointments             In 3 months Parks Ranger, Devonne Doughty, [facility], [place]   In 9 months Ralene Bathe, MD [place]

## 2022-03-05 DIAGNOSIS — J449 Chronic obstructive pulmonary disease, unspecified: Secondary | ICD-10-CM | POA: Insufficient documentation

## 2022-03-05 DIAGNOSIS — I890 Lymphedema, not elsewhere classified: Secondary | ICD-10-CM | POA: Insufficient documentation

## 2022-03-05 DIAGNOSIS — E785 Hyperlipidemia, unspecified: Secondary | ICD-10-CM | POA: Insufficient documentation

## 2022-03-05 NOTE — Progress Notes (Signed)
MRN : [id number]  Diana Mcneil is a 77 y.o. (02-Mar-1945) female who presents with chief complaint of legs swell.  History of Present Illness:   The patient returns to the office for followup evaluation regarding leg swelling.  The swelling has improved quite a bit and the pain associated with swelling has decreased substantially. There have not been any interval development of a ulcerations or wounds.  Since the previous visit the patient has been wearing graduated compression stockings and has noted some improvement in the lymphedema. The patient has been using compression routinely morning until night.  The patient also states elevation during the day and exercise (such as walking) is being done too.  Unfortunately she has been having difficulties with the pump they can turn it on but apparently it does not work    No outpatient medications have been marked as taking for the 03/06/22 encounter (Appointment) with Delana Meyer, Dolores Lory, MD.    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    Social History Social History   Tobacco Use   Smoking status: Former    Packs/day: 0.50    Years: 40.00    Total pack years: 20.00    Types: Cigarettes   Smokeless tobacco: Former  Scientific laboratory technician Use: Never used  Substance Use Topics   Alcohol use: Never   Drug use: Never    Family History Family History  Problem Relation Age of Onset   Diabetes Mother    Heart disease Mother    Stroke Mother    CAD Mother    Diabetes Father    Diabetes Paternal Grandfather     Allergies  Allergen Reactions   Black Pepper-Turmeric    Mushroom Extract  Complex Swelling   Sulfa Antibiotics Hives and Swelling     REVIEW OF SYSTEMS (Negative unless checked)  Constitutional: '[]'$ Weight loss  '[]'$ Fever  '[]'$ Chills Cardiac: '[]'$ Chest pain   '[]'$ Chest pressure   '[]'$ Palpitations   '[]'$ Shortness of breath when laying flat   '[]'$ Shortness of breath with exertion. Vascular:  '[]'$ Pain in legs with walking   '[x]'$ Pain in legs with standing  '[]'$ History of DVT   '[]'$ Phlebitis   '[x]'$ Swelling in legs   '[]'$ Varicose veins   '[]'$ Non-healing ulcers Pulmonary:   '[]'$ Uses home oxygen   '[]'$ Productive cough   '[]'$ Hemoptysis   '[]'$ Wheeze  '[]'$ COPD   '[]'$ Asthma Neurologic:  '[]'$ Dizziness   '[]'$ Seizures   '[]'$ History of stroke   '[]'$ History of TIA  '[]'$ Aphasia   '[]'$ Vissual changes   '[]'$ Weakness or numbness in arm   '[]'$ Weakness or numbness in leg Musculoskeletal:   '[]'$ Joint swelling   '[]'$ Joint pain   '[]'$ Low back pain Hematologic:  '[]'$ Easy bruising  '[]'$ Easy bleeding   '[]'$ Hypercoagulable state   '[]'$ Anemic Gastrointestinal:  '[]'$ Diarrhea   '[]'$ Vomiting  '[]'$ Gastroesophageal reflux/heartburn   '[]'$ Difficulty swallowing. Genitourinary:  '[]'$ Chronic kidney disease   '[]'$ Difficult urination  '[]'$ Frequent urination   '[]'$ Blood in urine Skin:  '[]'$ Rashes   '[]'$ Ulcers  Psychological:  '[]'$ History of anxiety   '[]'$  History of major depression.  Physical Examination  There  were no vitals filed for this visit. There is no height or weight on file to calculate BMI. Gen: WD/WN, NAD Head: [place]/AT, No temporalis wasting.  Ear/Nose/Throat: Hearing grossly intact, nares w/o erythema or drainage, pinna without lesions Eyes: PER, EOMI, sclera nonicteric.  Neck: Supple, no gross masses.  No JVD.  Pulmonary:  Good air movement, no audible wheezing, no use of accessory muscles.  Cardiac: RRR, precordium not hyperdynamic. Vascular:  scattered varicosities present bilaterally.  Mild venous stasis changes to the legs bilaterally.  3+ soft pitting edema, CEAP [id number]  Vessel Right Left  Radial Palpable Palpable  Gastrointestinal: soft, non-distended. No guarding/no  peritoneal signs.  Musculoskeletal: M/S 5/5 throughout.  No deformity.  Neurologic: CN 2-12 intact. Pain and light touch intact in extremities.  Symmetrical.  Speech is fluent. Motor exam as listed above. Psychiatric: Judgment intact, Mood & affect appropriate for pt's clinical situation. Dermatologic: Venous rashes no ulcers noted.  No changes consistent with cellulitis. Lymph : No lichenification or skin changes of chronic lymphedema.  CBC Lab Results  Component Value Date   WBC 4.9 06/01/2021   HGB 13.1 06/01/2021   HCT 40.9 06/01/2021   MCV 94.7 06/01/2021   PLT 156 06/01/2021    BMET    Component Value Date/Time   NA 141 06/01/2021 0806   K 4.0 06/01/2021 0806   CL 104 06/01/2021 0806   CO2 31 06/01/2021 0806   GLUCOSE 84 06/01/2021 0806   BUN 20 06/01/2021 0806   CREATININE 0.68 06/01/2021 0806   CALCIUM 8.9 06/01/2021 0806   GFRNONAA 77 05/25/2020 0925   GFRAA 90 05/25/2020 0925   CrCl cannot be calculated (Patient's most recent lab result is older than the maximum 21 days allowed.).  COAG No results found for: "INR", "PROTIME"  Radiology No results found.   Assessment/Plan 1. Lymphedema Recommend:  No surgery or intervention at this point in time.    I have reviewed my discussion with the patient regarding lymphedema and why it  causes symptoms.  Patient will continue wearing graduated compression on a daily basis. The patient should put the compression on first thing in the morning and removing them in the evening. The patient should not sleep in the compression.   In addition, behavioral modification throughout the day will be continued.  This will include frequent elevation (such as in a recliner), use of over the counter pain medications as needed and exercise such as walking.  The systemic causes for chronic edema such as liver, kidney and cardiac etiologies does not appear to have significant changed over the past year.    The patient will continue  use of the  lymph pump once we get the pump working.  We will call Matt with bio tab to see if he can arrange a follow-up with the patient.  This will continue to improve the edema control and prevent sequela such as ulcers and infections.   The patient will follow-up with me on an annual basis.   2. Primary osteoarthritis involving multiple joints Continue NSAID medications as already ordered, these medications have been reviewed and there are no changes at this time.  Continued activity and therapy was stressed.  3. Chronic obstructive pulmonary disease, unspecified COPD type ([place]) Continue pulmonary medications and aerosols as already ordered, these medications have been reviewed and there are no changes at this time.   4. Mixed hyperlipidemia Continue statin as ordered and reviewed, no changes at this time    Hortencia Pilar, MD  03/05/2022 12:55 PM

## 2022-03-06 ENCOUNTER — Encounter (INDEPENDENT_AMBULATORY_CARE_PROVIDER_SITE_OTHER): Payer: Self-pay | Admitting: Vascular Surgery

## 2022-03-06 ENCOUNTER — Ambulatory Visit (INDEPENDENT_AMBULATORY_CARE_PROVIDER_SITE_OTHER): Payer: 59 | Admitting: Vascular Surgery

## 2022-03-06 VITALS — BP 130/84 | HR 70 | Resp 16

## 2022-03-06 DIAGNOSIS — E782 Mixed hyperlipidemia: Secondary | ICD-10-CM | POA: Diagnosis not present

## 2022-03-06 DIAGNOSIS — I890 Lymphedema, not elsewhere classified: Secondary | ICD-10-CM

## 2022-03-06 DIAGNOSIS — M159 Polyosteoarthritis, unspecified: Secondary | ICD-10-CM

## 2022-03-06 DIAGNOSIS — J449 Chronic obstructive pulmonary disease, unspecified: Secondary | ICD-10-CM | POA: Diagnosis not present

## 2022-03-11 ENCOUNTER — Encounter (INDEPENDENT_AMBULATORY_CARE_PROVIDER_SITE_OTHER): Payer: Self-pay | Admitting: Vascular Surgery

## 2022-04-05 ENCOUNTER — Other Ambulatory Visit: Payer: Self-pay | Admitting: Family Medicine

## 2022-04-05 ENCOUNTER — Encounter: Payer: Self-pay | Admitting: Family Medicine

## 2022-04-05 ENCOUNTER — Ambulatory Visit (INDEPENDENT_AMBULATORY_CARE_PROVIDER_SITE_OTHER): Payer: 59 | Admitting: Family Medicine

## 2022-04-05 VITALS — BP 124/80 | HR 84 | Ht 61.0 in | Wt 240.0 lb

## 2022-04-05 DIAGNOSIS — Z0000 Encounter for general adult medical examination without abnormal findings: Secondary | ICD-10-CM

## 2022-04-05 DIAGNOSIS — E559 Vitamin D deficiency, unspecified: Secondary | ICD-10-CM

## 2022-04-05 DIAGNOSIS — K625 Hemorrhage of anus and rectum: Secondary | ICD-10-CM | POA: Diagnosis not present

## 2022-04-05 DIAGNOSIS — E785 Hyperlipidemia, unspecified: Secondary | ICD-10-CM

## 2022-04-05 DIAGNOSIS — E538 Deficiency of other specified B group vitamins: Secondary | ICD-10-CM

## 2022-04-05 DIAGNOSIS — R5383 Other fatigue: Secondary | ICD-10-CM | POA: Diagnosis not present

## 2022-04-05 DIAGNOSIS — R0609 Other forms of dyspnea: Secondary | ICD-10-CM | POA: Diagnosis not present

## 2022-04-05 DIAGNOSIS — Z23 Encounter for immunization: Secondary | ICD-10-CM | POA: Diagnosis not present

## 2022-04-05 DIAGNOSIS — F3342 Major depressive disorder, recurrent, in full remission: Secondary | ICD-10-CM

## 2022-04-05 DIAGNOSIS — R7309 Other abnormal glucose: Secondary | ICD-10-CM

## 2022-04-05 NOTE — Patient Instructions (Addendum)
Thank you for coming to the office today.  Try to monitor nutrition with your fatigue  Keep trying to improve exercise tolerance  Labs including B12 and Vitamin D next time.   For Constipation (less frequent bowel movement that can be hard dry or involve straining).  This could be the cause of the bright red blood that you have seen recently, may also have Internal Hemorrhoid that can cause the bleeding.  I would suggest monitoring it closely for any further repeat episodes. If it remains resolved, then we are all set and it is likely constipation or hemorrhoid.  If it keeps returning, we will need to refer to GI for further consultation. Or we can do colonoscopy or cologuard.  ---------------------  Recommend trying OTC Miralax 17g = 1 capful in large glass water once daily for now, try several days to see if working, goal is soft stool or BM 1-2 times daily, if too loose then reduce dose or try every other day. If not effective may need to increase it to 2 doses at once in AM or may do 1 in morning and 1 in afternoon/evening  - This medicine is very safe and can be used often without any problem and will not make you dehydrated. It is good for use on AS NEEDED BASIS or even MAINTENANCE therapy for longer term for several days to weeks at a time to help regulate bowel movements  Other more natural remedies or preventative treatment: - Increase hydration with water - Increase fiber in diet (high fiber foods = vegetables, leafy greens, oats/grains) - May take OTC Fiber supplement (metamucil powder or pill/gummy) - May try OTC Probiotic  DUE for FASTING BLOOD WORK (no food or drink after midnight before the lab appointment, only water or coffee without cream/sugar on the morning of)  SCHEDULE "Lab Only" visit in the morning at the clinic for lab draw in  5/1  - Make sure Lab Only appointment is at about 1 week before your next appointment, so that results will be available  For Lab  Results, once available within 2-3 days of blood draw, you can can log in to MyChart online to view your results and a brief explanation. Also, we can discuss results at next follow-up visit.    Please schedule a Follow-up Appointment to: Return if symptoms worsen or fail to improve, for keep apt in May.  If you have any other questions or concerns, please feel free to call the office or send a message through [place]. You may also schedule an earlier appointment if necessary.  Additionally, you may be receiving a survey about your experience at our office within a few days to 1 week by e-mail or mail. We value your feedback.  Nobie Putnam, DO [place]

## 2022-04-05 NOTE — Progress Notes (Signed)
Subjective:    Patient ID: Diana Mcneil, female    DOB: [birth year], 77 y.o.   MRN: [id number]  Diana Mcneil is a 77 y.o. female presenting on 04/05/2022 for Rectal Bleeding   HPI  Rectal Bleeding  Reports symptoms started 'Sunday night 3 nights ago with bright red per rectum moderate amount into toilet bowel and on toilet paper, this was during a bowel movement and she had mild straining but not much, and she took dulcolax. One significant episode, no further repeat episodes. No dark tarry stool. Stool appeared normal and blood was separate. She did not endorse any rectal pain with it or abdominal pain. She did have some constipation recently and has continued the dulcolax and has had normal bowel movements since that time.  History of 1 month ago had some lower R sided abdominal pain, it had some radiation to R side flank. Took Gabapentin.  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 Reported no significant issues, no polyps, done for future screening.      12'$ /12/2021    4:34 PM 08/11/2021    2:58 PM 06/06/2021    1:41 PM  Depression screen PHQ 2/9  Decreased Interest 1 0 0  Down, Depressed, Hopeless 1 0 0  PHQ - 2 Score 2 0 0  Altered sleeping 1 3 0  Tired, decreased energy '3 3 1  '$ Change in appetite 1 1 0  Feeling bad or failure about yourself  0 0 0  Trouble concentrating 0 0 0  Moving slowly or fidgety/restless 3 0 0  Suicidal thoughts 0 0 0  PHQ-9 Score '10 7 1  '$ Difficult doing work/chores Extremely dIfficult Not difficult at all Not difficult at all    Social History   Tobacco Use   Smoking status: Former    Packs/day: 0.50    Years: 40.00     Total pack years: 20.00    Types: Cigarettes   Smokeless tobacco: Former  Scientific laboratory technician Use: Never used  Substance Use Topics   Alcohol use: Never   Drug use: Never    Review of Systems Per HPI unless specifically indicated above     Objective:    BP 124/80   Pulse 84   Ht '5\' 1"'$  (1.549 m)   Wt 240 lb (108.9 kg)   SpO2 96%   BMI 45.35 kg/m   Wt Readings from Last 3 Encounters:  04/05/22 240 lb (108.9 kg)  01/16/22 252 lb (114.3 kg)  08/17/21 249 lb 12.8 oz (113.3 kg)    Physical Exam Vitals and nursing note reviewed.  Constitutional:      General: She is not in acute distress.    Appearance: She is well-developed. She is obese. She is not diaphoretic.     Comments: Well-appearing, comfortable, cooperative  HENT:     Head: Normocephalic and atraumatic.  Eyes:     General:        Right eye: No discharge.        Left eye: No discharge.     Conjunctiva/sclera: Conjunctivae normal.  Neck:     Thyroid: No thyromegaly.  Cardiovascular:     Rate and Rhythm: Normal rate and regular rhythm.  Heart sounds: Normal heart sounds. No murmur heard. Pulmonary:     Effort: Pulmonary effort is normal. No respiratory distress.     Breath sounds: Normal breath sounds. No wheezing or rales.  Musculoskeletal:        General: Normal range of motion.     Cervical back: Normal range of motion and neck supple.  Lymphadenopathy:     Cervical: No cervical adenopathy.  Skin:    General: Skin is warm and dry.     Findings: No erythema or rash.  Neurological:     Mental Status: She is alert and oriented to person, place, and time.  Psychiatric:        Behavior: Behavior normal.     Comments: Well groomed, good eye contact, normal speech and thoughts       Results for orders placed or performed in visit on 06/01/21  TSH  Result Value Ref Range   TSH 3.10 0.40 - 4.50 mIU/L  Hemoglobin A1c  Result Value Ref Range   Hgb A1c MFr Bld 5.2 <5.7 % of total Hgb   Mean  Plasma Glucose 103 mg/dL   eAG (mmol/L) 5.7 mmol/L  CBC with Differential/Platelet  Result Value Ref Range   WBC 4.9 3.8 - 10.8 Thousand/uL   RBC 4.32 3.80 - 5.10 Million/uL   Hemoglobin 13.1 11.7 - 15.5 g/dL   HCT 40.9 35.0 - 45.0 %   MCV 94.7 80.0 - 100.0 fL   MCH 30.3 27.0 - 33.0 pg   MCHC 32.0 32.0 - 36.0 g/dL   RDW 12.4 11.0 - 15.0 %   Platelets 156 140 - 400 Thousand/uL   MPV 11.4 7.5 - 12.5 fL   Neutro Abs 2,470 1,500 - 7,800 cells/uL   Lymphs Abs 1,784 850 - 3,900 cells/uL   Absolute Monocytes 446 200 - 950 cells/uL   Eosinophils Absolute 172 15 - 500 cells/uL   Basophils Absolute 29 0 - 200 cells/uL   Neutrophils Relative % 50.4 %   Total Lymphocyte 36.4 %   Monocytes Relative 9.1 %   Eosinophils Relative 3.5 %   Basophils Relative 0.6 %  Lipid panel  Result Value Ref Range   Cholesterol 160 <200 mg/dL   HDL 76 > OR = 50 mg/dL   Triglycerides 75 <150 mg/dL   LDL Cholesterol (Calc) 68 mg/dL (calc)   Total CHOL/HDL Ratio 2.1 <5.0 (calc)   Non-HDL Cholesterol (Calc) 84 <130 mg/dL (calc)  COMPLETE METABOLIC PANEL WITH GFR  Result Value Ref Range   Glucose, Bld 84 65 - 99 mg/dL   BUN 20 7 - 25 mg/dL   Creat 0.68 0.60 - 1.00 mg/dL   eGFR 91 > OR = 60 mL/min/1.1m   BUN/Creatinine Ratio NOT APPLICABLE 6 - 22 (calc)   Sodium 141 135 - 146 mmol/L   Potassium 4.0 3.5 - 5.3 mmol/L   Chloride 104 98 - 110 mmol/L   CO2 31 20 - 32 mmol/L   Calcium 8.9 8.6 - 10.4 mg/dL   Total Protein 6.0 (L) 6.1 - 8.1 g/dL   Albumin 3.8 3.6 - 5.1 g/dL   Globulin 2.2 1.9 - 3.7 g/dL (calc)   AG Ratio 1.7 1.0 - 2.5 (calc)   Total Bilirubin 0.7 0.2 - 1.2 mg/dL   Alkaline phosphatase (APISO) 82 37 - 153 U/L   AST 20 10 - 35 U/L   ALT 13 6 - 29 U/L      Assessment & Plan:   Problem List Items Addressed This  Visit   None Visit Diagnoses     Rectal bleeding    -  Primary   Dyspnea on exertion       Other fatigue          DOE / Fatigue Chronic, uncertain precise etiology seems  multifactorial  Try to monitor nutrition with your fatigue Keep trying to improve exercise tolerance Labs including B12 and Vitamin D next time.  Rectal bleeding BRBPR Likely int hemorrhoid or other source Prior colonoscopy negative  Discussed options for diagnostic, she prefers to avoid at this time.  Seems resolved.  I would suggest monitoring it closely for any further repeat episodes. If it remains resolved, then we are all set and it is likely constipation or hemorrhoid.  If it keeps returning, we will need to refer to GI for further consultation. Or we can do colonoscopy or cologuard.  Constipation therapy discussed, add miralax OTC  No orders of the defined types were placed in this encounter.    Follow up plan: Return if symptoms worsen or fail to improve, for keep apt in May.  Future labs ordered for 06/07/22 add vitamin testing  Nobie Putnam, DO [facility] 04/05/2022, 1:33 PM

## 2022-05-29 ENCOUNTER — Other Ambulatory Visit: Payer: Self-pay | Admitting: Family Medicine

## 2022-05-29 DIAGNOSIS — E785 Hyperlipidemia, unspecified: Secondary | ICD-10-CM

## 2022-05-29 DIAGNOSIS — M810 Age-related osteoporosis without current pathological fracture: Secondary | ICD-10-CM

## 2022-05-29 DIAGNOSIS — J3089 Other allergic rhinitis: Secondary | ICD-10-CM

## 2022-05-29 DIAGNOSIS — K219 Gastro-esophageal reflux disease without esophagitis: Secondary | ICD-10-CM

## 2022-05-29 DIAGNOSIS — F3342 Major depressive disorder, recurrent, in full remission: Secondary | ICD-10-CM

## 2022-05-29 NOTE — Telephone Encounter (Signed)
Medication Refill - Medication: alendronate (FOSAMAX) 70 MG tablet, montelukast (SINGULAIR) 10 MG tablet, DULoxetine (CYMBALTA) 20 MG capsule,  rosuvastatin (CRESTOR) 20 MG tablet, and pantoprazole (PROTONIX) 40 MG tablet  Has the patient contacted their pharmacy? No.   Preferred Pharmacy (with phone number or street name):  [facility] - [place], [place] - [address] Phone: [phone]  Fax: [phone]     Has the patient been seen for an appointment in the last year OR does the patient have an upcoming appointment? Yes.    Agent: Please be advised that RX refills may take up to 3 business days. We ask that you follow-up with your pharmacy.

## 2022-05-30 MED ORDER — MONTELUKAST SODIUM 10 MG PO TABS
10.0000 mg | ORAL_TABLET | Freq: Every day | ORAL | 0 refills | Status: DC
Start: 2022-05-30 — End: 2022-08-11

## 2022-05-30 MED ORDER — DULOXETINE HCL 20 MG PO CPEP
ORAL_CAPSULE | ORAL | 0 refills | Status: DC
Start: 2022-05-30 — End: 2022-06-16

## 2022-05-30 MED ORDER — PANTOPRAZOLE SODIUM 40 MG PO TBEC
40.0000 mg | DELAYED_RELEASE_TABLET | Freq: Every day | ORAL | 0 refills | Status: DC
Start: 2022-05-30 — End: 2022-08-31

## 2022-05-30 NOTE — Telephone Encounter (Signed)
Requested Prescriptions  Pending Prescriptions Disp Refills   alendronate (FOSAMAX) 70 MG tablet 12 tablet 3    Sig: TAKE 1 TABLET BY MOUTH WEEKLY  WITH 8 OZ OF PLAIN WATER 30  MINUTES BEFORE FIRST FOOD, DRINK OR MEDS. STAY UPRIGHT FOR 30  MINS     Endocrinology:  Bisphosphonates Failed - 05/29/2022  4:55 PM      Failed - Ca in normal range and within 360 days    Calcium  Date Value Ref Range Status  06/01/2021 8.9 8.6 - 10.4 mg/dL Final         Failed - Vitamin D in normal range and within 360 days    No results found for: "ZO1096EA5", "WU9811BJ4", "VD125OH2TOT", "25OHVITD3", "25OHVITD2", "25OHVITD1", "VD25OH"       Failed - Cr in normal range and within 360 days    Creat  Date Value Ref Range Status  06/01/2021 0.68 0.60 - 1.00 mg/dL Final         Failed - Mg Level in normal range and within 360 days    No results found for: "MG"       Failed - Phosphate in normal range and within 360 days    No results found for: "PHOS"       Failed - eGFR is 30 or above and within 360 days    GFR, Est African American  Date Value Ref Range Status  05/25/2020 90 > OR = 60 mL/min/1.21m2 Final   GFR, Est Non African American  Date Value Ref Range Status  05/25/2020 77 > OR = 60 mL/min/1.50m2 Final   eGFR  Date Value Ref Range Status  06/01/2021 91 > OR = 60 mL/min/1.82m2 Final    Comment:    The eGFR is based on the CKD-EPI 2021 equation. To calculate  the new eGFR from a previous Creatinine or Cystatin C result, go to https://www.kidney.org/professionals/ kdoqi/gfr%5Fcalculator          Failed - Bone Mineral Density or Dexa Scan completed in the last 2 years      Passed - Valid encounter within last 12 months    Recent Outpatient Visits           1 month ago Rectal bleeding   [place] [facility] Smitty Cords, DO   4 months ago Candidal intertrigo   [place] [facility] Smitty Cords, DO   11 months ago Annual  physical exam   [place] [facility] Smitty Cords, DO   1 year ago Major depression, recurrent, full remission [facility])   [place] [facility] Smitty Cords, DO   1 year ago Morbid obesity with BMI of 40.0-44.9, adult [facility])   [place] [facility] Althea Charon, Netta Neat, DO       Future Appointments             In 1 week Althea Charon, Netta Neat, DO [place] [facility], [place]   In 6 months Deirdre Evener, MD [place] [facility]             DULoxetine (CYMBALTA) 20 MG capsule 200 capsule 0     Psychiatry: Antidepressants - SNRI - duloxetine Failed - 05/29/2022  4:55 PM      Failed - Cr in normal range and within 360 days    Creat  Date Value Ref Range Status  06/01/2021 0.68 0.60 - 1.00 mg/dL Final  Failed - eGFR is 30 or above and within 360 days    GFR, Est African American  Date Value Ref Range Status  05/25/2020 90 > OR = 60 mL/min/1.21m2 Final   GFR, Est Non African American  Date Value Ref Range Status  05/25/2020 77 > OR = 60 mL/min/1.24m2 Final   eGFR  Date Value Ref Range Status  06/01/2021 91 > OR = 60 mL/min/1.88m2 Final    Comment:    The eGFR is based on the CKD-EPI 2021 equation. To calculate  the new eGFR from a previous Creatinine or Cystatin C result, go to https://www.kidney.org/professionals/ kdoqi/gfr%5Fcalculator          Passed - Completed PHQ-2 or PHQ-9 in the last 360 days      Passed - Last BP in normal range    BP Readings from Last 1 Encounters:  04/05/22 124/80         Passed - Valid encounter within last 6 months    Recent Outpatient Visits           1 month ago Rectal bleeding   [place] [facility], Netta Neat, DO   4 months ago Candidal intertrigo   [place] [facility] Smitty Cords, DO   11 months ago Annual physical exam   [place] [facility] Smitty Cords, DO   1 year ago Major depression, recurrent, full remission [facility])   [place] [facility] Smitty Cords, DO   1 year ago Morbid obesity with BMI of 40.0-44.9, adult [facility])   [place] [facility] Smitty Cords, DO       Future Appointments             In 1 week Althea Charon Netta Neat, DO [place] [facility], [place]   In 6 months Deirdre Evener, MD [place] [facility]             montelukast (SINGULAIR) 10 MG tablet 100 tablet 1    Sig: Take 1 tablet (10 mg total) by mouth at bedtime.     Pulmonology:  Leukotriene Inhibitors Passed - 05/29/2022  4:55 PM      Passed - Valid encounter within last 12 months    Recent Outpatient Visits           1 month ago Rectal bleeding   [place] [facility] Smitty Cords, DO   4 months ago Candidal intertrigo   [place] [facility] Smitty Cords, DO   11 months ago Annual physical exam   [place] [facility] Smitty Cords, DO   1 year ago Major depression, recurrent, full remission [facility])   [place] [facility] Smitty Cords, DO   1 year ago Morbid obesity with BMI of 40.0-44.9, adult [facility])   [place] [facility] Althea Charon, Netta Neat, DO       Future Appointments             In 1 week Althea Charon Netta Neat, DO [place] [facility], [place]   In 6 months Deirdre Evener, MD [place] [facility]             rosuvastatin (CRESTOR) 20 MG tablet 90 tablet 2    Sig: Take 1 tablet (20 mg total) by mouth at bedtime.  Cardiovascular:  Antilipid - Statins 2 Failed - 05/29/2022  4:55 PM      Failed - Cr in normal range and within 360 days    Creat  Date Value Ref Range Status  06/01/2021 0.68 0.60 - 1.00 mg/dL Final          Failed - Lipid Panel in normal range within the last 12 months    Cholesterol  Date Value Ref Range Status  06/01/2021 160 <200 mg/dL Final   LDL Cholesterol (Calc)  Date Value Ref Range Status  06/01/2021 68 mg/dL (calc) Final    Comment:    Reference range: <100 . Desirable range <100 mg/dL for primary prevention;   <70 mg/dL for patients with CHD or diabetic patients  with > or = 2 CHD risk factors. Marland Kitchen LDL-C is now calculated using the Martin-Hopkins  calculation, which is a validated novel method providing  better accuracy than the Friedewald equation in the  estimation of LDL-C.  Horald Pollen et al. Lenox Ahr. 0981;191(47): 2061-2068  (http://education.Quest[employer].com/faq/FAQ164)    HDL  Date Value Ref Range Status  06/01/2021 76 > OR = 50 mg/dL Final   Triglycerides  Date Value Ref Range Status  06/01/2021 75 <150 mg/dL Final         Passed - Patient is not pregnant      Passed - Valid encounter within last 12 months    Recent Outpatient Visits           1 month ago Rectal bleeding   [place] [facility] [place], Netta Neat, DO   4 months ago Candidal intertrigo   [place] [facility] Smitty Cords, DO   11 months ago Annual physical exam   [place] [facility] Smitty Cords, DO   1 year ago Major depression, recurrent, full remission [facility])   [place] [facility] Smitty Cords, DO   1 year ago Morbid obesity with BMI of 40.0-44.9, adult [facility])   [place] [facility] Althea Charon, Netta Neat, DO       Future Appointments             In 1 week Althea Charon, Netta Neat, DO [place] [facility], [place]   In 6 months Deirdre Evener, MD [place] [facility]             pantoprazole (PROTONIX) 40 MG tablet 90 tablet 3    Sig: Take 1 tablet (40 mg total) by mouth daily before breakfast.      Gastroenterology: Proton Pump Inhibitors Passed - 05/29/2022  4:55 PM      Passed - Valid encounter within last 12 months    Recent Outpatient Visits           1 month ago Rectal bleeding   [place] [facility] Smitty Cords, DO   4 months ago Candidal intertrigo   [place] [facility] Smitty Cords, DO   11 months ago Annual physical exam   [place] [facility] Smitty C[facility], DO   1 year ago Major depression, recurrent, full remission [facility])   [place] [facility] Smitty Cords, DO   1 year ago Morbid obesity with BMI of 40.0-44.9, adult [facility])   [place] [facility] Smitty Cords, DO       Future Appointments  In 1 week Althea Charon, Netta Neat, DO [place] [facility], [place]   In 6 months Deirdre Evener, MD [facility] [place]

## 2022-06-02 ENCOUNTER — Other Ambulatory Visit: Payer: Self-pay | Admitting: Family Medicine

## 2022-06-02 DIAGNOSIS — E785 Hyperlipidemia, unspecified: Secondary | ICD-10-CM

## 2022-06-02 NOTE — Telephone Encounter (Signed)
Requested Prescriptions  Pending Prescriptions Disp Refills   rosuvastatin (CRESTOR) 20 MG tablet [Pharmacy Med Name: Rosuvastatin Calcium 20 MG Oral Tablet] 100 tablet 2    Sig: TAKE 1 TABLET BY MOUTH AT  BEDTIME     Cardiovascular:  Antilipid - Statins 2 Failed - 06/02/2022  7:18 AM      Failed - Cr in normal range and within 360 days    Creat  Date Value Ref Range Status  06/01/2021 0.68 0.60 - 1.00 mg/dL Final         Failed - Lipid Panel in normal range within the last 12 months    Cholesterol  Date Value Ref Range Status  06/01/2021 160 <200 mg/dL Final   LDL Cholesterol (Calc)  Date Value Ref Range Status  06/01/2021 68 mg/dL (calc) Final    Comment:    Reference range: <100 . Desirable range <100 mg/dL for primary prevention;   <70 mg/dL for patients with CHD or diabetic patients  with > or = 2 CHD risk factors. Marland Kitchen LDL-C is now calculated using the Martin-Hopkins  calculation, which is a validated novel method providing  better accuracy than the Friedewald equation in the  estimation of LDL-C.  Horald Pollen et al. Lenox Ahr. 1610;960(45): 2061-2068  (http://education.[employer].com/faq/FAQ164)    HDL  Date Value Ref Range Status  06/01/2021 76 > OR = 50 mg/dL Final   Triglycerides  Date Value Ref Range Status  06/01/2021 75 <150 mg/dL Final         Passed - Patient is not pregnant      Passed - Valid encounter within last 12 months    Recent Outpatient Visits           1 month ago Rectal bleeding   [place] [facility] Smitty Cords, DO   4 months ago Candidal intertrigo   [place] [facility] Smitty Cords, DO   12 months ago Annual physical exam   [place] [facility] Smitty Cords, DO   1 year ago Major depression, recurrent, full remission [facility])   [place] [facility] Smitty Cords, DO   1 year ago Morbid obesity with BMI of  40.0-44.9, adult [facility])   [place] [facility] [place], Netta Neat, DO       Future Appointments             In 1 week Althea Charon, Netta Neat, DO [place] [facility], [place]   In 6 months Deirdre Evener, MD [facility]

## 2022-06-02 NOTE — Telephone Encounter (Signed)
Called pharmacy - this request was an automated request. Pt has 70 tablet refill remaining. Pharmacy will prepare.

## 2022-06-07 ENCOUNTER — Other Ambulatory Visit: Payer: 59

## 2022-06-07 ENCOUNTER — Other Ambulatory Visit: Payer: Medicare Other

## 2022-06-07 DIAGNOSIS — E559 Vitamin D deficiency, unspecified: Secondary | ICD-10-CM | POA: Diagnosis not present

## 2022-06-07 DIAGNOSIS — E538 Deficiency of other specified B group vitamins: Secondary | ICD-10-CM

## 2022-06-07 DIAGNOSIS — E785 Hyperlipidemia, unspecified: Secondary | ICD-10-CM | POA: Diagnosis not present

## 2022-06-07 DIAGNOSIS — R7309 Other abnormal glucose: Secondary | ICD-10-CM | POA: Diagnosis not present

## 2022-06-07 DIAGNOSIS — Z0000 Encounter for general adult medical examination without abnormal findings: Secondary | ICD-10-CM

## 2022-06-07 LAB — CBC WITH DIFFERENTIAL/PLATELET
Eosinophils Relative: 1.7 %
HCT: 42.4 % (ref 35.0–45.0)
Hemoglobin: 14 g/dL (ref 11.7–15.5)
MCHC: 33 g/dL (ref 32.0–36.0)
Neutrophils Relative %: 61.1 %
Platelets: 179 10*3/uL (ref 140–400)
RDW: 13.9 % (ref 11.0–15.0)
Total Lymphocyte: 28.6 %
WBC: 5.2 10*3/uL (ref 3.8–10.8)

## 2022-06-08 LAB — COMPLETE METABOLIC PANEL WITH GFR
AG Ratio: 1.6 (calc) (ref 1.0–2.5)
ALT: 7 U/L (ref 6–29)
AST: 16 U/L (ref 10–35)
Albumin: 3.9 g/dL (ref 3.6–5.1)
Alkaline phosphatase (APISO): 80 U/L (ref 37–153)
BUN/Creatinine Ratio: 14 (calc) (ref 6–22)
BUN: 8 mg/dL (ref 7–25)
CO2: 23 mmol/L (ref 20–32)
Calcium: 9 mg/dL (ref 8.6–10.4)
Chloride: 104 mmol/L (ref 98–110)
Creat: 0.57 mg/dL — ABNORMAL LOW (ref 0.60–1.00)
Globulin: 2.4 g/dL (calc) (ref 1.9–3.7)
Glucose, Bld: 92 mg/dL (ref 65–139)
Potassium: 4.1 mmol/L (ref 3.5–5.3)
Sodium: 140 mmol/L (ref 135–146)
Total Bilirubin: 0.8 mg/dL (ref 0.2–1.2)
Total Protein: 6.3 g/dL (ref 6.1–8.1)
eGFR: 94 mL/min/{1.73_m2} (ref >=60)

## 2022-06-08 LAB — LIPID PANEL
Cholesterol: 144 mg/dL (ref <200)
HDL: 59 mg/dL (ref >=50)
LDL Cholesterol (Calc): 67 mg/dL (calc)
Non-HDL Cholesterol (Calc): 85 mg/dL (calc) (ref <130)
Total CHOL/HDL Ratio: 2.4 (calc) (ref <5.0)
Triglycerides: 93 mg/dL (ref <150)

## 2022-06-08 LAB — CBC WITH DIFFERENTIAL/PLATELET
Absolute Monocytes: 416 cells/uL (ref 200–950)
Basophils Absolute: 31 cells/uL (ref 0–200)
Basophils Relative: 0.6 %
Eosinophils Absolute: 88 cells/uL (ref 15–500)
Lymphs Abs: 1487 cells/uL (ref 850–3900)
MCH: 30.8 pg (ref 27.0–33.0)
MCV: 93.4 fL (ref 80.0–100.0)
MPV: 11.6 fL (ref 7.5–12.5)
Monocytes Relative: 8 %
Neutro Abs: 3177 cells/uL (ref 1500–7800)
RBC: 4.54 10*6/uL (ref 3.80–5.10)

## 2022-06-08 LAB — VITAMIN B12: Vitamin B-12: 1306 pg/mL — ABNORMAL HIGH (ref 200–1100)

## 2022-06-08 LAB — HEMOGLOBIN A1C
Hgb A1c MFr Bld: 5.2 % of total Hgb (ref <5.7)
Mean Plasma Glucose: 103 mg/dL
eAG (mmol/L): 5.7 mmol/L

## 2022-06-08 LAB — TSH: TSH: 2.08 mIU/L (ref 0.40–4.50)

## 2022-06-08 LAB — VITAMIN D 25 HYDROXY (VIT D DEFICIENCY, FRACTURES): Vit D, 25-Hydroxy: 66 ng/mL (ref 30–100)

## 2022-06-12 ENCOUNTER — Encounter: Payer: Medicare Other | Admitting: Family Medicine

## 2022-06-16 ENCOUNTER — Encounter: Payer: Self-pay | Admitting: Family Medicine

## 2022-06-16 ENCOUNTER — Ambulatory Visit (INDEPENDENT_AMBULATORY_CARE_PROVIDER_SITE_OTHER): Payer: 59 | Admitting: Family Medicine

## 2022-06-16 VITALS — BP 126/72 | HR 87 | Temp 97.3°F | Wt 226.0 lb

## 2022-06-16 DIAGNOSIS — G8929 Other chronic pain: Secondary | ICD-10-CM

## 2022-06-16 DIAGNOSIS — M1711 Unilateral primary osteoarthritis, right knee: Secondary | ICD-10-CM

## 2022-06-16 DIAGNOSIS — G43809 Other migraine, not intractable, without status migrainosus: Secondary | ICD-10-CM

## 2022-06-16 DIAGNOSIS — M542 Cervicalgia: Secondary | ICD-10-CM

## 2022-06-16 DIAGNOSIS — B372 Candidiasis of skin and nail: Secondary | ICD-10-CM

## 2022-06-16 DIAGNOSIS — Z0000 Encounter for general adult medical examination without abnormal findings: Secondary | ICD-10-CM

## 2022-06-16 DIAGNOSIS — J3089 Other allergic rhinitis: Secondary | ICD-10-CM

## 2022-06-16 DIAGNOSIS — G4733 Obstructive sleep apnea (adult) (pediatric): Secondary | ICD-10-CM

## 2022-06-16 DIAGNOSIS — M810 Age-related osteoporosis without current pathological fracture: Secondary | ICD-10-CM | POA: Diagnosis not present

## 2022-06-16 DIAGNOSIS — M25561 Pain in right knee: Secondary | ICD-10-CM

## 2022-06-16 DIAGNOSIS — F3342 Major depressive disorder, recurrent, in full remission: Secondary | ICD-10-CM

## 2022-06-16 MED ORDER — GABAPENTIN 100 MG PO CAPS: 100.0000 mg | ORAL_CAPSULE | Freq: Two times a day (BID) | ORAL | 3 refills | Status: DC

## 2022-06-16 MED ORDER — AZELASTINE HCL 0.15 % NA SOLN
2.0000 | Freq: Two times a day (BID) | NASAL | 3 refills | Status: DC
Start: 2022-06-16 — End: 2022-06-21

## 2022-06-16 MED ORDER — NYSTATIN 100000 UNIT/GM EX POWD
1.0000 | Freq: Three times a day (TID) | CUTANEOUS | 2 refills | Status: AC
Start: 2022-06-16 — End: ?

## 2022-06-16 MED ORDER — DULOXETINE HCL 30 MG PO CPEP
30.0000 mg | ORAL_CAPSULE | Freq: Two times a day (BID) | ORAL | 3 refills | Status: DC
Start: 2022-06-16 — End: 2023-04-13

## 2022-06-16 MED ORDER — ALENDRONATE SODIUM 70 MG PO TABS: ORAL_TABLET | ORAL | 3 refills | Status: DC

## 2022-06-16 NOTE — Progress Notes (Signed)
Subjective:    Patient ID: Diana Mcneil, female    DOB: 1945/06/12, 77 y.o.   MRN: [id number]  Diana Mcneil is a 77 y.o. female presenting on 06/16/2022 for Annual Exam   HPI  Here for Annual Physical and Lab Review   HYPERLIPIDEMIA: - Reports no concerns. Last lipid panel 05/2022, controlled  - Currently taking Rosuvastatin 20mg , tolerating well without side effects or myalgias   Anxiety improved   LE Edema / Venous Insufficiency Previously on compression but not currently using. Limited elevation. Prolonged seated with legs down. No significant pain. Both sides swollen foot ankle leg.   Morbid Obesity BMI >42 A1c 5.2, normal range. She is on natural supplement, antioxidant weight loss regimen OTC Diet: improved, reduced portion, higher protein / fruit / salads veggies, limiting sugars, starches, drinking mostly water Continues on GoLo Weight down 14-15 lbs in past 3 months Less back pain  No new concerns on her weight, she plans to keep progressing toward goals.   Major Depression recurrent, in full remission Controlled. On Duloxetine 20mg  BID, in remission, no significant concerns.   Vitamin D Normal result.  Vitamin B12 1300 lab, higher than average  Additional concern Gradual worsening since last visit 04/05/22. She describes even short distance with walking will feel   Hot Flashes 9-10pm often  Heating pad and gabapentin help some  Dyspnea vs Fatigue Chronic problem With exertion and activity GoLo Diet lost 15 lbs but this was before her persistent fatigue Prior labs from 05/2021, negative for anemia, TSH, electrolytes Less exercise overall, can be deconditioning Knees, back arthritis bothering her   Health Maintenance: Shingrix vaccine    Approximately 4 years ago, last Colonoscopy done by Dr Greggory Stallion in [place]. Reported no significant issues, no polyps, done for future screening.      01/16/2022    4:34 PM 08/11/2021    2:58 PM 06/06/2021     1:41 PM  Depression screen PHQ 2/9  Decreased Interest 1 0 0  Down, Depressed, Hopeless 1 0 0  PHQ - 2 Score 2 0 0  Altered sleeping 1 3 0  Tired, decreased energy 3 3 1   Change in appetite 1 1 0  Feeling bad or failure about yourself  0 0 0  Trouble concentrating 0 0 0  Moving slowly or fidgety/restless 3 0 0  Suicidal thoughts 0 0 0  PHQ-9 Score 10 7 1   Difficult doing work/chores Extremely dIfficult Not difficult at all Not difficult at all    Past Medical History:  Diagnosis Date   Allergy    Anxiety    Headache    Hyperlipidemia    Migraine    Sleep apnea    Past Surgical History:  Procedure Laterality Date   ABDOMINAL HYSTERECTOMY     partial   BACK SURGERY  1989   BREAST BIOPSY Left 08/19/2020   Affirm bx-"X" clip-path pending   HERNIA REPAIR  09/13/2013   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   Social History   Socioeconomic History   Marital status: Single    Spouse name: Not on file   Number of children: Not on file   Years of education: high school   Highest education level: High school graduate  Occupational History   Not on file  Tobacco Use   Smoking status: Former    Packs/day: 0.50    Years: 40.00    Additional pack years: 0.00    Total pack years: 20.00    Types: Cigarettes  Smokeless tobacco: Former  Building services engineer Use: Never used  Substance and Sexual Activity   Alcohol use: Never   Drug use: Never   Sexual activity: Not on file  Other Topics Concern   Not on file  Social History Narrative   Not on file   Social Determinants of Health   Financial Resource Strain: Low Risk  (08/11/2021)   Overall Financial Resource Strain (CARDIA)    Difficulty of Paying Living Expenses: Not hard at all  Food Insecurity: No Food Insecurity (08/11/2021)   Hunger Vital Sign    Worried About Running Out of Food in the Last Year: Never true    Ran Out of Food in the Last Year: Never true  Transportation Needs: No Transportation Needs (08/11/2021)    PRAPARE - Administrator, Civil Service (Medical): No    Lack of Transportation (Non-Medical): No  Physical Activity: Inactive (07/20/2020)   Exercise Vital Sign    Days of Exercise per Week: 0 days    Minutes of Exercise per Session: 0 min  Stress: Stress Concern Present (07/20/2020)   [employer] of Occupational Health - Occupational Stress Questionnaire    Feeling of Stress : To some extent  Social Connections: Socially Isolated (08/11/2021)   Social Connection and Isolation Panel [NHANES]    Frequency of Communication with Friends and Family: Once a week    Frequency of Social Gatherings with Friends and Family: Never    Attends Religious Services: Never    Database administrator or Organizations: No    Attends Banker Meetings: Never    Marital Status: Living with partner  Intimate Partner Violence: Not At Risk (08/11/2021)   Humiliation, Afraid, Rape, and Kick questionnaire    Fear of Current or Ex-Partner: No    Emotionally Abused: No    Physically Abused: No    Sexually Abused: No   Family History  Problem Relation Age of Onset   Diabetes Mother    Heart disease Mother    Stroke Mother    CAD Mother    Diabetes Father    Diabetes Paternal Grandfather    Current Outpatient Medications on File Prior to Visit  Medication Sig   ascorbic acid (VITAMIN C) 500 MG tablet Take by mouth.   aspirin 81 MG EC tablet Take by mouth.   Cholecalciferol 25 MCG (1000 UT) tablet Take by mouth.   fluticasone (FLONASE) 50 MCG/ACT nasal spray Place 2 sprays into both nostrils daily.   montelukast (SINGULAIR) 10 MG tablet Take 1 tablet (10 mg total) by mouth at bedtime.   pantoprazole (PROTONIX) 40 MG tablet Take 1 tablet (40 mg total) by mouth daily before breakfast.   rosuvastatin (CRESTOR) 20 MG tablet TAKE 1 TABLET BY MOUTH AT  BEDTIME   SUMAtriptan (IMITREX) 100 MG tablet Take 1 tablet (100 mg total) by mouth once as needed for migraine. May repeat one dose  in 2 hours if headache persists, for max dose 24 hours   triamcinolone ointment (KENALOG) 0.5 % Apply 1 Application topically 2 (two) times daily. For 1-2 weeks affected area only then stop.   No current facility-administered medications on file prior to visit.    Review of Systems  Constitutional:  Positive for fatigue. Negative for activity change, appetite change, chills, diaphoresis and fever.  HENT:  Negative for congestion and hearing loss.   Eyes:  Negative for visual disturbance.  Respiratory:  Negative for cough, chest tightness, shortness of  breath and wheezing.   Cardiovascular:  Negative for chest pain, palpitations and leg swelling.  Gastrointestinal:  Negative for abdominal pain, constipation, diarrhea, nausea and vomiting.  Genitourinary:  Negative for dysuria, frequency and hematuria.  Musculoskeletal:  Positive for back pain. Negative for arthralgias and neck pain.  Skin:  Negative for rash.  Neurological:  Negative for dizziness, weakness, light-headedness, numbness and headaches.  Hematological:  Negative for adenopathy.  Psychiatric/Behavioral:  Negative for behavioral problems, dysphoric mood and sleep disturbance.    Per HPI unless specifically indicated above      Objective:    BP 126/72 (BP Location: Right Arm, Patient Position: Sitting, Cuff Size: Normal)   Pulse 87   Temp (!) 97.3 F (36.3 C) (Temporal)   Wt 226 lb (102.5 kg)   SpO2 95%   BMI 42.70 kg/m   Wt Readings from Last 3 Encounters:  06/16/22 226 lb (102.5 kg)  04/05/22 240 lb (108.9 kg)  01/16/22 252 lb (114.3 kg)    Physical Exam Vitals and nursing note reviewed.  Constitutional:      General: She is not in acute distress.    Appearance: She is well-developed. She is obese. She is not diaphoretic.     Comments: Well-appearing, comfortable, cooperative  HENT:     Head: Normocephalic and atraumatic.  Eyes:     General:        Right eye: No discharge.        Left eye: No discharge.      Conjunctiva/sclera: Conjunctivae normal.     Pupils: Pupils are equal, round, and reactive to light.  Neck:     Thyroid: No thyromegaly.  Cardiovascular:     Rate and Rhythm: Normal rate and regular rhythm.     Pulses: Normal pulses.     Heart sounds: Normal heart sounds. No murmur heard. Pulmonary:     Effort: Pulmonary effort is normal. No respiratory distress.     Breath sounds: Normal breath sounds. No wheezing or rales.  Abdominal:     General: Bowel sounds are normal. There is no distension.     Palpations: Abdomen is soft. There is no mass.     Tenderness: There is no abdominal tenderness.  Musculoskeletal:        General: No tenderness. Normal range of motion.     Cervical back: Normal range of motion and neck supple.     Right lower leg: No edema.     Left lower leg: No edema.     Comments: Upper / Lower Extremities: - Normal muscle tone, strength bilateral upper extremities 5/5, lower extremities 5/5  Lymphadenopathy:     Cervical: No cervical adenopathy.  Skin:    General: Skin is warm and dry.     Findings: No erythema or rash.  Neurological:     Mental Status: She is alert and oriented to person, place, and time.     Comments: Distal sensation intact to light touch all extremities  Psychiatric:        Mood and Affect: Mood normal.        Behavior: Behavior normal.        Thought Content: Thought content normal.     Comments: Well groomed, good eye contact, normal speech and thoughts       Results for orders placed or performed in visit on 06/07/22  Vitamin B12  Result Value Ref Range   Vitamin B-12 1,306 (H) 200 - 1,100 pg/mL  VITAMIN D 25 Hydroxy (Vit-D Deficiency,  Fractures)  Result Value Ref Range   Vit D, 25-Hydroxy 66 30 - 100 ng/mL  TSH  Result Value Ref Range   TSH 2.08 0.40 - 4.50 mIU/L  Hemoglobin A1c  Result Value Ref Range   Hgb A1c MFr Bld 5.2 <5.7 % of total Hgb   Mean Plasma Glucose 103 mg/dL   eAG (mmol/L) 5.7 mmol/L  Lipid panel   Result Value Ref Range   Cholesterol 144 <200 mg/dL   HDL 59 > OR = 50 mg/dL   Triglycerides 93 <811 mg/dL   LDL Cholesterol (Calc) 67 mg/dL (calc)   Total CHOL/HDL Ratio 2.4 <5.0 (calc)   Non-HDL Cholesterol (Calc) 85 <914 mg/dL (calc)  CBC with Differential/Platelet  Result Value Ref Range   WBC 5.2 3.8 - 10.8 Thousand/uL   RBC 4.54 3.80 - 5.10 Million/uL   Hemoglobin 14.0 11.7 - 15.5 g/dL   HCT 78.2 95.6 - 21.3 %   MCV 93.4 80.0 - 100.0 fL   MCH 30.8 27.0 - 33.0 pg   MCHC 33.0 32.0 - 36.0 g/dL   RDW 08.6 57.8 - 46.9 %   Platelets 179 140 - 400 Thousand/uL   MPV 11.6 7.5 - 12.5 fL   Neutro Abs 3,177 1,500 - 7,800 cells/uL   Lymphs Abs 1,487 850 - 3,900 cells/uL   Absolute Monocytes 416 200 - 950 cells/uL   Eosinophils Absolute 88 15 - 500 cells/uL   Basophils Absolute 31 0 - 200 cells/uL   Neutrophils Relative % 61.1 %   Total Lymphocyte 28.6 %   Monocytes Relative 8.0 %   Eosinophils Relative 1.7 %   Basophils Relative 0.6 %  COMPLETE METABOLIC PANEL WITH GFR  Result Value Ref Range   Glucose, Bld 92 65 - 139 mg/dL   BUN 8 7 - 25 mg/dL   Creat 6.29 (L) 5.28 - 1.00 mg/dL   eGFR 94 > OR = 60 UX/LKG/4.01U2   BUN/Creatinine Ratio 14 6 - 22 (calc)   Sodium 140 135 - 146 mmol/L   Potassium 4.1 3.5 - 5.3 mmol/L   Chloride 104 98 - 110 mmol/L   CO2 23 20 - 32 mmol/L   Calcium 9.0 8.6 - 10.4 mg/dL   Total Protein 6.3 6.1 - 8.1 g/dL   Albumin 3.9 3.6 - 5.1 g/dL   Globulin 2.4 1.9 - 3.7 g/dL (calc)   AG Ratio 1.6 1.0 - 2.5 (calc)   Total Bilirubin 0.8 0.2 - 1.2 mg/dL   Alkaline phosphatase (APISO) 80 37 - 153 U/L   AST 16 10 - 35 U/L   ALT 7 6 - 29 U/L      Assessment & Plan:   Problem List Items Addressed This Visit     Chronic pain of right knee   Relevant Medications   gabapentin (NEURONTIN) 100 MG capsule   Major depression, recurrent, full remission (HCC)   Relevant Medications   DULoxetine (CYMBALTA) 30 MG capsule   Osteoporosis of lumbar spine   Relevant  Medications   alendronate (FOSAMAX) 70 MG tablet   Other Relevant Orders   DG Bone Density   Primary osteoarthritis of right knee   Relevant Medications   gabapentin (NEURONTIN) 100 MG capsule   DULoxetine (CYMBALTA) 30 MG capsule   Other Visit Diagnoses     Annual physical exam    -  Primary   Seasonal allergic rhinitis due to other allergic trigger       Relevant Medications   Azelastine HCl 0.15 % SOLN  Other migraine without status migrainosus, not intractable       Relevant Medications   gabapentin (NEURONTIN) 100 MG capsule   DULoxetine (CYMBALTA) 30 MG capsule   Candidal intertrigo       Relevant Medications   nystatin (MYCOSTATIN/NYSTOP) powder   Chronic neck pain       Relevant Medications   gabapentin (NEURONTIN) 100 MG capsule   DULoxetine (CYMBALTA) 30 MG capsule       Updated Health Maintenance information Reviewed recent lab results with patient Encouraged improvement to lifestyle with diet and exercise Goal of weight loss  Low Energy / Hot Flashes / Neck Pain Dose increase Duloxetine from 20 to 30mg , take 30mg  capsule TWICE a day. This should help with hopefully energy, neck pain, and also maybe lessens the hot flashes.  Increased the frequency of Gabapentin 100mg , now take twice a day, more regularly, at least once every day. Caution sedation.  Labs are excellent.  Vitamin D supplement. Okay to continue  VItamin B12 good to continue.   Meds ordered this encounter  Medications   alendronate (FOSAMAX) 70 MG tablet    Sig: TAKE 1 TABLET BY MOUTH WEEKLY  WITH 8 OZ OF PLAIN WATER 30  MINUTES BEFORE FIRST FOOD, DRINK OR MEDS. STAY UPRIGHT FOR 30  MINS    Dispense:  12 tablet    Refill:  3    Requesting 1 year supply   Azelastine HCl 0.15 % SOLN    Sig: Place 2 sprays into the nose in the morning and at bedtime.    Dispense:  90 mL    Refill:  3   gabapentin (NEURONTIN) 100 MG capsule    Sig: Take 1 capsule (100 mg total) by mouth 2 (two) times  daily.    Dispense:  200 capsule    Refill:  3   DULoxetine (CYMBALTA) 30 MG capsule    Sig: Take 1 capsule (30 mg total) by mouth 2 (two) times daily.    Dispense:  200 capsule    Refill:  3    Dose increase from 20mg  to 30mg    nystatin (MYCOSTATIN/NYSTOP) powder    Sig: Apply 1 Application topically 3 (three) times daily. For 2-4 weeks. May repeat if needed.    Dispense:  30 g    Refill:  2      Follow up plan: Return in about 3 months (around 09/16/2022) for 3 month follow-up updates, medications, energy/joint pain.  Saralyn Pilar, DO [facility] 06/16/2022, 1:59 PM

## 2022-06-16 NOTE — Patient Instructions (Addendum)
Thank you for coming to the office today.  Dose increase Duloxetine from 20 to 30mg , take 30mg  capsule TWICE a day. This should help with hopefully energy, neck pain, and also maybe lessens the hot flashes.  Increased the frequency of Gabapentin 100mg , now take twice a day, more regularly, at least once every day. Caution sedation.  Labs are excellent.  Vitamin D supplement. Okay to continue  VItamin B12 good to continue.  Recent Labs    06/07/22 1051  HGBA1C 5.2   Lipid Panel     Component Value Date/Time   CHOL 144 06/07/2022 1051   TRIG 93 06/07/2022 1051   HDL 59 06/07/2022 1051   CHOLHDL 2.4 06/07/2022 1051   LDLCALC 67 06/07/2022 1051     Chemistry      Component Value Date/Time   NA 140 06/07/2022 1051   K 4.1 06/07/2022 1051   CL 104 06/07/2022 1051   CO2 23 06/07/2022 1051   BUN 8 06/07/2022 1051   CREATININE 0.57 (L) 06/07/2022 1051      Component Value Date/Time   CALCIUM 9.0 06/07/2022 1051   AST 16 06/07/2022 1051   ALT 7 06/07/2022 1051   BILITOT 0.8 06/07/2022 1051       Please schedule a Follow-up Appointment to: Return in about 3 months (around 09/16/2022) for 3 month follow-up updates, medications, energy/joint pain.  If you have any other questions or concerns, please feel free to call the office or send a message through MyChart. You may also schedule an earlier appointment if necessary.  Additionally, you may be receiving a survey about your experience at our office within a few days to 1 week by e-mail or mail. We value your feedback.  Saralyn Pilar, DO [facility], [place]

## 2022-06-21 ENCOUNTER — Telehealth: Payer: Self-pay

## 2022-06-21 DIAGNOSIS — J3089 Other allergic rhinitis: Secondary | ICD-10-CM

## 2022-06-21 MED ORDER — AZELASTINE HCL 0.1 % NA SOLN
2.0000 | Freq: Two times a day (BID) | NASAL | 3 refills | Status: AC
Start: 2022-06-21 — End: ?

## 2022-06-21 NOTE — Telephone Encounter (Signed)
Azelastine 0.15% nasal spray has been discontinued by the manufacturer and is no longer available.  Please provider a new prescription, alternative Azelastine 0.1% nasal spray.

## 2022-08-09 ENCOUNTER — Telehealth: Payer: Self-pay | Admitting: Family Medicine

## 2022-08-09 NOTE — Telephone Encounter (Signed)
LM 08/09/2022 to schedule AWV   Diana Mcneil; Care Guide Ambulatory Clinical Support [place] [facility] Direct Dial: [phone]

## 2022-08-11 ENCOUNTER — Other Ambulatory Visit: Payer: Self-pay | Admitting: Family Medicine

## 2022-08-11 DIAGNOSIS — J3089 Other allergic rhinitis: Secondary | ICD-10-CM

## 2022-08-11 NOTE — Telephone Encounter (Signed)
Requested medication (s) are due for refill today: yes   Requested medication (s) are on the active medication list: yes  Last refill:  05/30/22 #100 0 refills   Future visit scheduled: yes in 1 month  Notes to clinic:  requesting 1 year supply . No refills remain. Do you want to refill for 1 year?     Requested Prescriptions  Pending Prescriptions Disp Refills   montelukast (SINGULAIR) 10 MG tablet [Pharmacy Med Name: Montelukast Sodium 10 MG Oral Tablet] 100 tablet 2    Sig: TAKE 1 TABLET BY MOUTH AT  BEDTIME     Pulmonology:  Leukotriene Inhibitors Passed - 08/11/2022  5:05 AM      Passed - Valid encounter within last 12 months    Recent Outpatient Visits           1 month ago Annual physical exam   [place] [facility] Smitty Cords, DO   4 months ago Rectal bleeding   [place] [facility] Smitty Cords, DO   6 months ago Candidal intertrigo   [place] [facility] Smitty Cords, DO   1 year ago Annual physical exam   [facility] [facility] Smitty Cords, DO   1 year ago Major depression, recurrent, full remission [facility])   [place] [facility] Althea Charon, Netta Neat, DO       Future Appointments             In 1 month Althea Charon, Netta Neat, DO [place] [facility] [place]   In 4 months Deirdre Evener, MD [facility]

## 2022-08-25 ENCOUNTER — Ambulatory Visit (INDEPENDENT_AMBULATORY_CARE_PROVIDER_SITE_OTHER): Payer: 59

## 2022-08-25 VITALS — Ht 61.0 in | Wt 226.0 lb

## 2022-08-25 DIAGNOSIS — Z0000 Encounter for general adult medical examination without abnormal findings: Secondary | ICD-10-CM | POA: Diagnosis not present

## 2022-08-25 NOTE — Progress Notes (Signed)
Subjective:   Diana Mcneil is a 77 y.o. female who presents for Medicare Annual (Subsequent) preventive examination.  Per patient no change in vitals since last visit, unable to obtain new vitals due to telehealth visit   Visit Complete: Virtual  I connected with  Tobie Lords on 08/25/22 by a audio enabled telemedicine application and verified that I am speaking with the correct person using two identifiers.  Patient Location: Home  Provider Location: Office/Clinic  I discussed the limitations of evaluation and management by telemedicine. The patient expressed understanding and agreed to proceed.   Review of Systems     Cardiac Risk Factors include: advanced age (>31men, >84 women);obesity (BMI >30kg/m2);dyslipidemia     Objective:    Today's Vitals   08/25/22 1432 08/25/22 1449  Weight:  226 lb (102.5 kg)  Height:  5\' 1"  (1.549 m)  PainSc: 5     Body mass index is 42.7 kg/m.     08/25/2022    2:40 PM 07/20/2020    1:29 PM  Advanced Directives  Does Patient Have a Medical Advance Directive? No Yes  Type of Special educational needs teacher of [place];Living will  Copy of Healthcare Power of Attorney in Chart?  No - copy requested  Would patient like information on creating a medical advance directive? No - Patient declined     Current Medications (verified) Outpatient Encounter Medications as of 08/25/2022  Medication Sig   alendronate (FOSAMAX) 70 MG tablet TAKE 1 TABLET BY MOUTH WEEKLY  WITH 8 OZ OF PLAIN WATER 30  MINUTES BEFORE FIRST FOOD, DRINK OR MEDS. STAY UPRIGHT FOR 30  MINS   ascorbic acid (VITAMIN C) 500 MG tablet Take by mouth.   aspirin 81 MG EC tablet Take by mouth.   azelastine (ASTELIN) 0.1 % nasal spray Place 2 sprays into both nostrils 2 (two) times daily. Use in each nostril as directed   Cholecalciferol 25 MCG (1000 UT) tablet Take by mouth.   DULoxetine (CYMBALTA) 30 MG capsule Take 1 capsule (30 mg total) by mouth 2 (two) times  daily.   gabapentin (NEURONTIN) 100 MG capsule Take 1 capsule (100 mg total) by mouth 2 (two) times daily.   montelukast (SINGULAIR) 10 MG tablet TAKE 1 TABLET BY MOUTH AT  BEDTIME   nystatin (MYCOSTATIN/NYSTOP) powder Apply 1 Application topically 3 (three) times daily. For 2-4 weeks. May repeat if needed.   pantoprazole (PROTONIX) 40 MG tablet Take 1 tablet (40 mg total) by mouth daily before breakfast.   rosuvastatin (CRESTOR) 20 MG tablet TAKE 1 TABLET BY MOUTH AT  BEDTIME   SUMAtriptan (IMITREX) 100 MG tablet Take 1 tablet (100 mg total) by mouth once as needed for migraine. May repeat one dose in 2 hours if headache persists, for max dose 24 hours   triamcinolone ointment (KENALOG) 0.5 % Apply 1 Application topically 2 (two) times daily. For 1-2 weeks affected area only then stop.   fluticasone (FLONASE) 50 MCG/ACT nasal spray Place 2 sprays into both nostrils daily. (Patient not taking: Reported on 08/25/2022)   No facility-administered encounter medications on file as of 08/25/2022.    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   Family History  Problem Relation Age of Onset   Diabetes Mother    Heart disease Mother    Stroke Mother    CAD Mother    Diabetes Father    Diabetes Paternal Grandfather    Social History   Socioeconomic History   Marital status: Single    Spouse name: Not on file   Number of children: Not on file   Years of education: high school   Highest education level: High school graduate  Occupational History   Not on file   Tobacco Use   Smoking status: Former    Current packs/day: 0.50    Average packs/day: 0.5 packs/day for 40.0 years (20.0 ttl pk-yrs)    Types: Cigarettes   Smokeless tobacco: Former  Building services engineer status: Never Used  Substance and Sexual Activity   Alcohol use: Never   Drug use: Never   Sexual activity: Not on file  Other Topics Concern   Not on file  Social History Narrative   Not on file   Social Determinants of Health   Financial Resource Strain: Low Risk  (08/25/2022)   Overall Financial Resource Strain (CARDIA)    Difficulty of Paying Living Expenses: Not hard at all  Food Insecurity: No Food Insecurity (08/25/2022)   Hunger Vital Sign    Worried About Running Out of Food in the Last Year: Never true    Ran Out of Food in the Last Year: Never true  Transportation Needs: No Transportation Needs (08/25/2022)   PRAPARE - Administrator, Civil Service (Medical): No    Lack of Transportation (Non-Medical): No  Physical Activity: Insufficiently Active (08/25/2022)   Exercise Vital Sign    Days of Exercise per Week: 4 days    Minutes of Exercise per Session: 20 min  Stress: No Stress Concern Present (08/25/2022)   [employer] of Occupational Health - Occupational Stress Questionnaire    Feeling of Stress : Only a little  Social Connections: Socially Isolated (08/25/2022)   Social Connection and Isolation Panel [NHANES]    Frequency of Communication with Friends and Family: Once a week    Frequency of Social Gatherings with Friends and Family: Never    Attends Religious Services: Never    Diplomatic Services operational officer: No    Attends Engineer, structural: Never    Marital Status: Living with partner    Tobacco Counseling Counseling given: Not Answered   Clinical Intake:  Pre-visit preparation completed: Yes  Pain : 0-10 Pain Score: 5  Pain Type: Chronic pain Pain Location: Knee Pain Orientation: Left     Nutritional  Risks: None Diabetes: No  How often do you need to have someone help you when you read instructions, pamphlets, or other written materials from your doctor or pharmacy?: 1 - Never  Interpreter Needed?: No  Information entered by :: Kennedy Bucker, LPN   Activities of Daily Living    08/25/2022    2:41 PM  In your present state of health, do you have any difficulty performing the following activities:  Hearing? 0  Vision? 0  Difficulty concentrating or making decisions? 0  Walking or climbing stairs? 1  Comment knees  Dressing or bathing? 0  Doing errands, shopping? 0  Preparing Food and eating ? N  Using the Toilet? N  In the past six months, have you accidently leaked urine? N  Do you have problems with loss of bowel control? N  Managing your Medications? N  Managing your Finances? N  Housekeeping or managing your Housekeeping? N    Patient Care Team: Smitty Cords, DO as PCP - General (Family Medicine) Debbe Odea, MD as PCP - Cardiology (Cardiology)  Indicate any recent Medical Services you may have received from other than [facility] providers in the past year (date may be approximate).     Assessment:   This is a routine wellness examination for [place].  Hearing/Vision screen Hearing Screening - Comments:: No aids Vision Screening - Comments:: Wears glasses- Dr.Thurmond  Dietary issues and exercise activities discussed:     Goals Addressed             This Visit's Progress    DIET - EAT MORE FRUITS AND VEGETABLES         Depression Screen    08/25/2022    2:37 PM 01/16/2022    4:34 PM 08/11/2021    2:58 PM 06/06/2021    1:41 PM 03/30/2021    1:52 PM 08/24/2020    1:27 PM 07/20/2020    1:31 PM  PHQ 2/9 Scores  PHQ - 2 Score 1 2 0 0 0 0 0  PHQ- 9 Score 2 10 7 1 1 1      Fall Risk    08/25/2022    2:41 PM 08/11/2021    2:58 PM 03/30/2021    1:52 PM 08/24/2020    1:27 PM 07/20/2020    1:30 PM  Fall Risk   Falls in the past year? 0 0 0 0 0   Number falls in past yr: 0 0 0 0   Injury with Fall? 0 0 0 0   Risk for fall due to : No Fall Risks No Fall Risks Impaired mobility  Medication side effect  Follow up Falls prevention discussed;Falls evaluation completed Falls evaluation completed Falls evaluation completed Falls evaluation completed Falls evaluation completed;Education provided;Falls prevention discussed    MEDICARE RISK AT HOME:  Medicare Risk at Home - 08/25/22 1442     Any stairs in or around the home? No    If so, are there any without handrails? No    Home free of loose throw rugs in walkways, pet beds, electrical cords, etc? Yes    Adequate lighting in your home to reduce risk of falls? Yes    Life alert? No    Use of a cane, walker or w/c? Yes   uses rollator   Grab bars in the bathroom? Yes    Shower chair or bench in shower? Yes    Elevated toilet seat or a handicapped toilet? Yes             TIMED UP AND GO:  Was the test performed?  No    Cognitive Function:        08/25/2022    2:42 PM 08/11/2021    3:03 PM 07/20/2020    1:34 PM  6CIT Screen  What Year? 0 points 0 points 0 points  What month? 0 points 0 points 0 points  What time? 3 points 0 points 0 points  Count back from 20 0 points 0 points 0 points  Months in reverse 0 points 0 points 0 points  Repeat phrase 0 points 0 points 4 points  Total Score 3 points 0 points 4 points  Immunizations Immunization History  Administered Date(s) Administered   Fluad Quad(high Dose 65+) 11/10/2019, 01/16/2022   Influenza Split 03/13/2016   Influenza, High Dose Seasonal PF 12/18/2016   Pneumococcal Conjugate-13 08/07/2018   Pneumococcal Polysaccharide-23 12/18/2016   Tdap 04/05/2022   Zoster Recombinant(Shingrix) 08/01/2019, 06/06/2021    TDAP status: Up to date  Flu Vaccine status: Up to date  Pneumococcal vaccine status: Up to date  Covid-19 vaccine status: Declined, Education has been provided regarding the importance of this  vaccine but patient still declined. Advised may receive this vaccine at local pharmacy or Health Dept.or vaccine clinic. Aware to provide a copy of the vaccination record if obtained from local pharmacy or Health Dept. Verbalized acceptance and understanding.  Qualifies for Shingles Vaccine? Yes   Zostavax completed No   Shingrix Completed?: Yes  Screening Tests Health Maintenance  Topic Date Due   DEXA SCAN  Never done   INFLUENZA VACCINE  09/07/2022   Medicare Annual Wellness (AWV)  08/25/2023   DTaP/Tdap/Td (2 - Td or Tdap) 04/05/2032   Pneumonia Vaccine 78+ Years old  Completed   Hepatitis C Screening  Completed   Zoster Vaccines- Shingrix  Completed   HPV VACCINES  Aged Out   COVID-19 Vaccine  Discontinued    Health Maintenance  Health Maintenance Due  Topic Date Due   DEXA SCAN  Never done    Colorectal cancer screening: No longer required.   Mammogram status: No longer required due to age.  Bone Density status: Ordered SCHEDULED FOR 09/14/22. Pt provided with contact info and advised to call to schedule appt.  Lung Cancer Screening: (Low Dose CT Chest recommended if Age 15-80 years, 20 pack-year currently smoking OR have quit w/in 15years.) does not qualify.    Additional Screening:  Hepatitis C Screening: does qualify; Completed 05/25/20  Vision Screening: Recommended annual ophthalmology exams for early detection of glaucoma and other disorders of the eye. Is the patient up to date with their annual eye exam?  Yes  Who is the provider or what is the name of the office in which the patient attends annual eye exams? Dr.Woodard If pt is not established with a provider, would they like to be referred to a provider to establish care? No .   Dental Screening: Recommended annual dental exams for proper oral hygiene   Community Resource Referral / Chronic Care Management: CRR required this visit?  No   CCM required this visit?  No     Plan:     I have  personally reviewed and noted the following in the patient's chart:   Medical and social history Use of alcohol, tobacco or illicit drugs  Current medications and supplements including opioid prescriptions. Patient is not currently taking opioid prescriptions. Functional ability and status Nutritional status Physical activity Advanced directives List of other physicians Hospitalizations, surgeries, and ER visits in previous 12 months Vitals Screenings to include cognitive, depression, and falls Referrals and appointments  In addition, I have reviewed and discussed with patient certain preventive protocols, quality metrics, and best practice recommendations. A written personalized care plan for preventive services as well as general preventive health recommendations were provided to patient.     Hal Hope, LPN   1/61/0960   After Visit Summary: (MyChart) Due to this being a telephonic visit, the after visit summary with patients personalized plan was offered to patient via MyChart   Nurse Notes: none

## 2022-08-25 NOTE — Patient Instructions (Signed)
Ms. Diana Mcneil , Thank you for taking time to come for your Medicare Wellness Visit. I appreciate your ongoing commitment to your health goals. Please review the following plan we discussed and let me know if I can assist you in the future.   These are the goals we discussed:  Goals      DIET - EAT MORE FRUITS AND VEGETABLES     Patient Stated     07/20/2020, wants to weigh 125-130 pounds        This is a list of the screening recommended for you and due dates:  Health Maintenance  Topic Date Due   DEXA scan (bone density measurement)  Never done   Flu Shot  09/07/2022   Medicare Annual Wellness Visit  08/25/2023   DTaP/Tdap/Td vaccine (2 - Td or Tdap) 04/05/2032   Pneumonia Vaccine  Completed   Hepatitis C Screening  Completed   Zoster (Shingles) Vaccine  Completed   HPV Vaccine  Aged Out   COVID-19 Vaccine  Discontinued    Advanced directives: no  Conditions/risks identified: none  Next appointment: Follow up in one year for your annual wellness visit 09/07/23 @ 1:00 pm by phone   Preventive Care 65 Years and Older, Female Preventive care refers to lifestyle choices and visits with your health care provider that can promote health and wellness. What does preventive care include? A yearly physical exam. This is also called an annual well check. Dental exams once or twice a year. Routine eye exams. Ask your health care provider how often you should have your eyes checked. Personal lifestyle choices, including: Daily care of your teeth and gums. Regular physical activity. Eating a healthy diet. Avoiding tobacco and drug use. Limiting alcohol use. Practicing safe sex. Taking low-dose aspirin every day. Taking vitamin and mineral supplements as recommended by your health care provider. What happens during an annual well check? The services and screenings done by your health care provider during your annual well check will depend on your age, overall health, lifestyle risk  factors, and family history of disease. Counseling  Your health care provider may ask you questions about your: Alcohol use. Tobacco use. Drug use. Emotional well-being. Home and relationship well-being. Sexual activity. Eating habits. History of falls. Memory and ability to understand (cognition). Work and work Astronomer. Reproductive health. Screening  You may have the following tests or measurements: Height, weight, and BMI. Blood pressure. Lipid and cholesterol levels. These may be checked every 5 years, or more frequently if you are over 57 years old. Skin check. Lung cancer screening. You may have this screening every year starting at age 57 if you have a 30-pack-year history of smoking and currently smoke or have quit within the past 15 years. Fecal occult blood test (FOBT) of the stool. You may have this test every year starting at age 101. Flexible sigmoidoscopy or colonoscopy. You may have a sigmoidoscopy every 5 years or a colonoscopy every 10 years starting at age 61. Hepatitis C blood test. Hepatitis B blood test. Sexually transmitted disease (STD) testing. Diabetes screening. This is done by checking your blood sugar (glucose) after you have not eaten for a while (fasting). You may have this done every 1-3 years. Bone density scan. This is done to screen for osteoporosis. You may have this done starting at age 51. Mammogram. This may be done every 1-2 years. Talk to your health care provider about how often you should have regular mammograms. Talk with your health care provider  about your test results, treatment options, and if necessary, the need for more tests. Vaccines  Your health care provider may recommend certain vaccines, such as: Influenza vaccine. This is recommended every year. Tetanus, diphtheria, and acellular pertussis (Tdap, Td) vaccine. You may need a Td booster every 10 years. Zoster vaccine. You may need this after age 10. Pneumococcal 13-valent  conjugate (PCV13) vaccine. One dose is recommended after age 68. Pneumococcal polysaccharide (PPSV23) vaccine. One dose is recommended after age 76. Talk to your health care provider about which screenings and vaccines you need and how often you need them. This information is not intended to replace advice given to you by your health care provider. Make sure you discuss any questions you have with your health care provider. Document Released: 02/19/2015 Document Revised: 10/13/2015 Document Reviewed: 11/24/2014 Elsevier Interactive Patient Education  2017 [employer].  Fall Prevention in the Home Falls can cause injuries. They can happen to people of all ages. There are many things you can do to make your home safe and to help prevent falls. What can I do on the outside of my home? Regularly fix the edges of walkways and driveways and fix any cracks. Remove anything that might make you trip as you walk through a door, such as a raised step or threshold. Trim any bushes or trees on the path to your home. Use bright outdoor lighting. Clear any walking paths of anything that might make someone trip, such as rocks or tools. Regularly check to see if handrails are loose or broken. Make sure that both sides of any steps have handrails. Any raised decks and porches should have guardrails on the edges. Have any leaves, snow, or ice cleared regularly. Use sand or salt on walking paths during winter. Clean up any spills in your garage right away. This includes oil or grease spills. What can I do in the bathroom? Use night lights. Install grab bars by the toilet and in the tub and shower. Do not use towel bars as grab bars. Use non-skid mats or decals in the tub or shower. If you need to sit down in the shower, use a plastic, non-slip stool. Keep the floor dry. Clean up any water that spills on the floor as soon as it happens. Remove soap buildup in the tub or shower regularly. Attach bath mats  securely with double-sided non-slip rug tape. Do not have throw rugs and other things on the floor that can make you trip. What can I do in the bedroom? Use night lights. Make sure that you have a light by your bed that is easy to reach. Do not use any sheets or blankets that are too big for your bed. They should not hang down onto the floor. Have a firm chair that has side arms. You can use this for support while you get dressed. Do not have throw rugs and other things on the floor that can make you trip. What can I do in the kitchen? Clean up any spills right away. Avoid walking on wet floors. Keep items that you use a lot in easy-to-reach places. If you need to reach something above you, use a strong step stool that has a grab bar. Keep electrical cords out of the way. Do not use floor polish or wax that makes floors slippery. If you must use wax, use non-skid floor wax. Do not have throw rugs and other things on the floor that can make you trip. What can I do  with my stairs? Do not leave any items on the stairs. Make sure that there are handrails on both sides of the stairs and use them. Fix handrails that are broken or loose. Make sure that handrails are as long as the stairways. Check any carpeting to make sure that it is firmly attached to the stairs. Fix any carpet that is loose or worn. Avoid having throw rugs at the top or bottom of the stairs. If you do have throw rugs, attach them to the floor with carpet tape. Make sure that you have a light switch at the top of the stairs and the bottom of the stairs. If you do not have them, ask someone to add them for you. What else can I do to help prevent falls? Wear shoes that: Do not have high heels. Have rubber bottoms. Are comfortable and fit you well. Are closed at the toe. Do not wear sandals. If you use a stepladder: Make sure that it is fully opened. Do not climb a closed stepladder. Make sure that both sides of the stepladder  are locked into place. Ask someone to hold it for you, if possible. Clearly mark and make sure that you can see: Any grab bars or handrails. First and last steps. Where the edge of each step is. Use tools that help you move around (mobility aids) if they are needed. These include: Canes. Walkers. Scooters. Crutches. Turn on the lights when you go into a dark area. Replace any light bulbs as soon as they burn out. Set up your furniture so you have a clear path. Avoid moving your furniture around. If any of your floors are uneven, fix them. If there are any pets around you, be aware of where they are. Review your medicines with your doctor. Some medicines can make you feel dizzy. This can increase your chance of falling. Ask your doctor what other things that you can do to help prevent falls. This information is not intended to replace advice given to you by your health care provider. Make sure you discuss any questions you have with your health care provider. Document Released: 11/19/2008 Document Revised: 07/01/2015 Document Reviewed: 02/27/2014 Elsevier Interactive Patient Education  2017 [employer].

## 2022-08-28 ENCOUNTER — Ambulatory Visit (INDEPENDENT_AMBULATORY_CARE_PROVIDER_SITE_OTHER): Payer: 59 | Admitting: Vascular Surgery

## 2022-08-28 ENCOUNTER — Encounter (INDEPENDENT_AMBULATORY_CARE_PROVIDER_SITE_OTHER): Payer: Self-pay

## 2022-08-29 ENCOUNTER — Telehealth (INDEPENDENT_AMBULATORY_CARE_PROVIDER_SITE_OTHER): Payer: Self-pay

## 2022-08-29 NOTE — Telephone Encounter (Signed)
Pt was sick on Monday and needs to reschedule appt

## 2022-08-31 ENCOUNTER — Other Ambulatory Visit: Payer: Self-pay | Admitting: Family Medicine

## 2022-08-31 DIAGNOSIS — K219 Gastro-esophageal reflux disease without esophagitis: Secondary | ICD-10-CM

## 2022-08-31 NOTE — Telephone Encounter (Signed)
Requested Prescriptions  Pending Prescriptions Disp Refills   pantoprazole (PROTONIX) 40 MG tablet [Pharmacy Med Name: Pantoprazole Sodium 40 MG Oral Tablet Delayed Release] 100 tablet 2    Sig: TAKE 1 TABLET BY MOUTH DAILY  BEFORE BREAKFAST     Gastroenterology: Proton Pump Inhibitors Passed - 08/31/2022  5:03 AM      Passed - Valid encounter within last 12 months    Recent Outpatient Visits           2 months ago Annual physical exam   [place] [facility] Smitty Cords, DO   4 months ago Rectal bleeding   [place] [facility] Smitty Cords, DO   7 months ago Candidal intertrigo   [place] [facility] Smitty Cords, DO   1 year ago Annual physical exam   [place] [facility] Smitty Cords, DO   1 year ago Major depression, recurrent, full remission [facility])   [place] [facility] Althea Charon, Netta Neat, DO       Future Appointments             In 2 weeks Althea Charon, Netta Neat, DO  [facility], [place]   In 3 months Deirdre Evener, MD [place] [facility]

## 2022-09-14 ENCOUNTER — Other Ambulatory Visit: Payer: 59

## 2022-09-19 ENCOUNTER — Ambulatory Visit: Payer: 59 | Admitting: Family Medicine

## 2022-09-20 DIAGNOSIS — D3121 Benign neoplasm of right retina: Secondary | ICD-10-CM | POA: Diagnosis not present

## 2022-10-03 ENCOUNTER — Encounter: Payer: Self-pay | Admitting: Family Medicine

## 2022-10-03 ENCOUNTER — Ambulatory Visit (INDEPENDENT_AMBULATORY_CARE_PROVIDER_SITE_OTHER): Payer: 59 | Admitting: Family Medicine

## 2022-10-03 VITALS — BP 101/64 | HR 71 | Ht 61.0 in | Wt 215.0 lb

## 2022-10-03 DIAGNOSIS — M1711 Unilateral primary osteoarthritis, right knee: Secondary | ICD-10-CM | POA: Diagnosis not present

## 2022-10-03 DIAGNOSIS — Z96652 Presence of left artificial knee joint: Secondary | ICD-10-CM | POA: Diagnosis not present

## 2022-10-03 DIAGNOSIS — M159 Polyosteoarthritis, unspecified: Secondary | ICD-10-CM

## 2022-10-03 DIAGNOSIS — Z6841 Body Mass Index (BMI) 40.0 and over, adult: Secondary | ICD-10-CM

## 2022-10-03 NOTE — Patient Instructions (Addendum)
Thank you for coming to the office today.  We will do Knee X-rays here at the office. - Walk in during business hours, not Fridays.  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  I am surprised that the weight loss did not resolve or help some of your joint pain.  Once we review your results, and we can refer you to my recommendation to see the Sports Medicine specialist, non surgical.  After you see them and do their eval and likely MRI they will need consider the Orthopedic referral.   [facility]  Dr Joseph Berkshire [facility] [address] [place], [place] [address] [phone]   Please schedule a Follow-up Appointment to: Return if symptoms worsen or fail to improve.  If you have any other questions or concerns, please feel free to call the office or send a message through MyChart. You may also schedule an earlier appointment if necessary.  Additionally, you may be receiving a survey about your experience at our office within a few days to 1 week by e-mail or mail. We value your feedback.  Saralyn Pilar, DO [facility], [place]

## 2022-10-03 NOTE — Progress Notes (Signed)
Subjective:    Patient ID: Diana Mcneil, female    DOB: 11-Dec-1945, 77 y.o.   MRN: [id number]  Diana Mcneil is a 77 y.o. female presenting on 10/03/2022 for Fatigue (Still tired ) and Joint Pain (Still having joint pain )   HPI  Osteoarthritis multiple joints, knees bilateral Morbid Obesity BMI >40 Fatigue, tired Generalized Aching overall Back of neck and both knees She has lost 45 lbs since 01/2022, overall improving weight trend in past 3 months down 10-15 lbs. Improved on diet and lifestyle and supplements Unfortunately No improvement in knee pain with the weight loss Left knee still bothers her s/p TKR years prior, done in [place] [facility] She has episodic flares of R knee. She said experienced a sudden "grab" on her R knee it felt feeling of tight No recent X-rays She is not ready to pursue surgery at this time Previously advised bone on bone   Past Surgical History:  Procedure Laterality Date   ABDOMINAL HYSTERECTOMY     partial   BACK SURGERY  1989   BREAST BIOPSY Left 08/19/2020   Affirm bx-"X" clip-path pending   HERNIA REPAIR  09/13/2013   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        10/03/2022    4:15 PM 08/25/2022    2:37 PM 01/16/2022    4:34 PM  Depression screen PHQ 2/9  Decreased Interest 1 0 1  Down, Depressed, Hopeless 0 1 1  PHQ - 2 Score 1 1 2   Altered sleeping 2 0 1  Tired, decreased energy 3 1 3   Change in appetite 0 0 1  Feeling bad or failure about yourself  0 0 0  Trouble concentrating 0 0 0  Moving slowly or fidgety/restless 3 0 3  Suicidal thoughts 0 0 0  PHQ-9 Score 9 2 10   Difficult doing work/chores Somewhat difficult Not difficult at all Extremely dIfficult    Social History   Tobacco Use   Smoking status: Former    Current packs/day: 0.50    Average packs/day: 0.5 packs/day for 40.0 years (20.0 ttl pk-yrs)    Types: Cigarettes   Smokeless tobacco: Former  Building services engineer status: Never Used  Substance  Use Topics   Alcohol use: Never   Drug use: Never    Review of Systems Per HPI unless specifically indicated above     Objective:    BP 101/64   Pulse 71   Ht 5\' 1"  (1.549 m)   Wt 215 lb (97.5 kg)   SpO2 96%   BMI 40.62 kg/m   Wt Readings from Last 3 Encounters:  10/03/22 215 lb (97.5 kg)  08/25/22 226 lb (102.5 kg)  06/16/22 226 lb (102.5 kg)    Physical Exam Vitals and nursing note reviewed.  Constitutional:      General: She is not in acute distress.    Appearance: Normal appearance. She is well-developed. She is obese. She is not diaphoretic.     Comments: Well-appearing, comfortable, cooperative. Notable weight loss  HENT:     Head: Normocephalic and atraumatic.  Eyes:     General:        Right eye: No discharge.        Left eye: No discharge.     Conjunctiva/sclera: Conjunctivae normal.  Cardiovascular:     Rate and Rhythm: Normal rate.  Pulmonary:     Effort: Pulmonary effort is normal.  Musculoskeletal:     Comments: Bilateral knees with deformity.  L s/p TKR. R is bulky with appearance of arthritis, crepitus and some reduced ROM  Skin:    General: Skin is warm and dry.     Findings: No erythema or rash.  Neurological:     Mental Status: She is alert and oriented to person, place, and time.  Psychiatric:        Mood and Affect: Mood normal.        Behavior: Behavior normal.        Thought Content: Thought content normal.     Comments: Well groomed, good eye contact, normal speech and thoughts      I have personally reviewed the radiology report from 01/19/12 - MRI  [facility] MRI LUMBAR SPINE W/WO [facility]  Anatomical Region Laterality Modality  -- -- Magnetic Resonance   Result Transcription  Diana Lewis, MD - 12/21/2003 10:08 PM EST Formatting of this note might be different from the original. Patient: Diana Mcneil   [id number] ______________________________________________________________________________  Rad Rpt:  Final    12/21/2003  22:08  Req# [id number]     Acct#   [facility]I MRI LUMBAR SPINE W/WO [facility]    MRI lumbar spine dated 12/21/03.  Social security number: [phone].  Indication:  HNP  Technique: Axial and sagittal FSE T2, the T1-weighted pre and post contrast images were obtained through the lumbar spine.  Findings: No bone marrow signal abnormality. Vertebral body alignment is within normal limits. Conus terminates at L1.  L1-L2: Seen on the sagittal images only. Minimal broad-based disc bulge.  L2-L3: There is a mild broad-based disc bulge which has a thin rim of peripheral increased T1 signal, likely calcification. This causes ventral impression upon the thecal sac. No neuroforaminal stenosis.  L3-4: Laminectomy defect is identified at L4. Mild broad-based disc bulge with mild/moderate bilateral degenerative facet disease and ligamentum flavum hypertrophy, left greater than right which deforms the left posterolateral thecal sac.  No neuroforaminal stenosis.  L4-L5: Mild broad-based disc bulge and bilateral degenerative facet disease without significant spinal canal stenosis. There is mild right neuroforaminal stenosis.  L5-S1: Mild broad-based disc bulge and bilateral degenerative facet disease without spinal canal stenosis. There is mild right neuroforaminal stenosis.  Impression:  1. Multi-level disc bulges with degenerative facet disease as described above. Mild right neuroforaminal stenosis at L4-5 and L5-S1. Correlate clinically.    I have reviewed the films and concur with the above findings.     Report Release Date/Time: [id number] Resident MD: Diana Settle MD: Diana Lewis MD  ORDERING MD: Diana Racer MD ORDER REASON: L SPINE W/WOE/HNP/DR. [name],[name]               Exam End: -- Last Resulted: 01/19/12 23:48  Received From: [facility]  Result Received: 02/20/22 13:50    Results for orders placed or performed in visit on 06/07/22   Vitamin B12  Result Value Ref Range   Vitamin B-12 1,306 (H) 200 - 1,100 pg/mL  VITAMIN D 25 Hydroxy (Vit-D Deficiency, Fractures)  Result Value Ref Range   Vit D, 25-Hydroxy 66 30 - 100 ng/mL  TSH  Result Value Ref Range   TSH 2.08 0.40 - 4.50 mIU/L  Hemoglobin A1c  Result Value Ref Range   Hgb A1c MFr Bld 5.2 <5.7 % of total Hgb   Mean Plasma Glucose 103 mg/dL   eAG (mmol/L) 5.7 mmol/L  Lipid panel  Result Value Ref Range   Cholesterol 144 <200 mg/dL   HDL 59 > OR = 50 mg/dL  Triglycerides 93 <150 mg/dL   LDL Cholesterol (Calc) 67 mg/dL (calc)   Total CHOL/HDL Ratio 2.4 <5.0 (calc)   Non-HDL Cholesterol (Calc) 85 <161 mg/dL (calc)  CBC with Differential/Platelet  Result Value Ref Range   WBC 5.2 3.8 - 10.8 Thousand/uL   RBC 4.54 3.80 - 5.10 Million/uL   Hemoglobin 14.0 11.7 - 15.5 g/dL   HCT 09.6 04.5 - 40.9 %   MCV 93.4 80.0 - 100.0 fL   MCH 30.8 27.0 - 33.0 pg   MCHC 33.0 32.0 - 36.0 g/dL   RDW 81.1 91.4 - 78.2 %   Platelets 179 140 - 400 Thousand/uL   MPV 11.6 7.5 - 12.5 fL   Neutro Abs 3,177 1,500 - 7,800 cells/uL   Lymphs Abs 1,487 850 - 3,900 cells/uL   Absolute Monocytes 416 200 - 950 cells/uL   Eosinophils Absolute 88 15 - 500 cells/uL   Basophils Absolute 31 0 - 200 cells/uL   Neutrophils Relative % 61.1 %   Total Lymphocyte 28.6 %   Monocytes Relative 8.0 %   Eosinophils Relative 1.7 %   Basophils Relative 0.6 %  COMPLETE METABOLIC PANEL WITH GFR  Result Value Ref Range   Glucose, Bld 92 65 - 139 mg/dL   BUN 8 7 - 25 mg/dL   Creat 9.56 (L) 2.13 - 1.00 mg/dL   eGFR 94 > OR = 60 YQ/MVH/8.46N6   BUN/Creatinine Ratio 14 6 - 22 (calc)   Sodium 140 135 - 146 mmol/L   Potassium 4.1 3.5 - 5.3 mmol/L   Chloride 104 98 - 110 mmol/L   CO2 23 20 - 32 mmol/L   Calcium 9.0 8.6 - 10.4 mg/dL   Total Protein 6.3 6.1 - 8.1 g/dL   Albumin 3.9 3.6 - 5.1 g/dL   Globulin 2.4 1.9 - 3.7 g/dL (calc)   AG Ratio 1.6 1.0 - 2.5 (calc)   Total Bilirubin 0.8 0.2 - 1.2  mg/dL   Alkaline phosphatase (APISO) 80 37 - 153 U/L   AST 16 10 - 35 U/L   ALT 7 6 - 29 U/L      Assessment & Plan:   Problem List Items Addressed This Visit     Morbid obesity with BMI of 40.0-44.9, adult (HCC) - Primary   Primary osteoarthritis involving multiple joints   Relevant Orders   DG Knee Complete 4 Views Right   DG Knee Complete 4 Views Left   Primary osteoarthritis of right knee   Relevant Orders   DG Knee Complete 4 Views Right   Other Visit Diagnoses     S/P TKR (total knee replacement), left       Relevant Orders   DG Knee Complete 4 Views Left       No orders of the defined types were placed in this encounter.  Significantly improved wt loss over >6-9 months Encourage continue lifestyle management  Still advanced osteoarthritis R Knee, limited relief from weight loss  S/p L TKR still with pain and symptoms  We will do Knee X-rays here at the office. - Walk in during business hours, not Fridays.  likely osteoarthritis degenerative wear and tear  Eval both knees L s/p TKR 4 yr ago with pain R with known advanced OA DJD  Once we review your results, and we can refer you to my recommendation to see the Sports Medicine specialist, non surgical.  After you see them and do their eval and likely MRI they will need consider the Orthopedic referral  If interested to pursue surgical intervention if indicated.   [facility]  Dr Joseph Berkshire [facility] [address] [place], [place] [address] [phone]  Orders Placed This Encounter  Procedures   DG Knee Complete 4 Views Right    Standing Status:   Future    Standing Expiration Date:   10/03/2023    Order Specific Question:   Reason for Exam (SYMPTOM  OR DIAGNOSIS REQUIRED)    Answer:   chronic osteoarthritis, pain    Order Specific Question:   Preferred imaging location?    Answer:   [facility] Knee Complete 4 Views Left    Standing Status:   Future     Standing Expiration Date:   10/03/2023    Order Specific Question:   Reason for Exam (SYMPTOM  OR DIAGNOSIS REQUIRED)    Answer:   s/p TKR Left knee 2020, still has pain    Order Specific Question:   Preferred imaging location?    Answer:   [facility] Cheree Ditto      Follow up plan: Return if symptoms worsen or fail to improve.   Saralyn Pilar, DO [facility] 10/03/2022, 4:32 PM

## 2022-10-26 ENCOUNTER — Ambulatory Visit (INDEPENDENT_AMBULATORY_CARE_PROVIDER_SITE_OTHER): Payer: 59

## 2022-10-26 ENCOUNTER — Ambulatory Visit
Admission: RE | Admit: 2022-10-26 | Discharge: 2022-10-26 | Disposition: A | Discharge status: Home / Self Care | Payer: 59 | Source: Ambulatory Visit | Attending: Family Medicine | Admitting: Family Medicine

## 2022-10-26 ENCOUNTER — Ambulatory Visit
Admission: RE | Admit: 2022-10-26 | Discharge: 2022-10-26 | Disposition: A | Discharge status: Home / Self Care | Payer: 59 | Attending: Family Medicine | Admitting: Family Medicine

## 2022-10-26 DIAGNOSIS — M159 Polyosteoarthritis, unspecified: Secondary | ICD-10-CM | POA: Diagnosis not present

## 2022-10-26 DIAGNOSIS — Z23 Encounter for immunization: Secondary | ICD-10-CM

## 2022-10-26 DIAGNOSIS — G8929 Other chronic pain: Secondary | ICD-10-CM | POA: Diagnosis not present

## 2022-10-26 DIAGNOSIS — M25562 Pain in left knee: Secondary | ICD-10-CM | POA: Diagnosis not present

## 2022-10-26 DIAGNOSIS — M1711 Unilateral primary osteoarthritis, right knee: Secondary | ICD-10-CM | POA: Diagnosis not present

## 2022-10-26 DIAGNOSIS — Z96652 Presence of left artificial knee joint: Secondary | ICD-10-CM

## 2022-10-26 DIAGNOSIS — Z471 Aftercare following joint replacement surgery: Secondary | ICD-10-CM | POA: Diagnosis not present

## 2022-11-09 ENCOUNTER — Other Ambulatory Visit: Payer: Self-pay | Admitting: Family Medicine

## 2022-11-09 ENCOUNTER — Ambulatory Visit: Payer: 59 | Admitting: Family Medicine

## 2022-11-09 DIAGNOSIS — Z96652 Presence of left artificial knee joint: Secondary | ICD-10-CM

## 2022-11-09 DIAGNOSIS — G8929 Other chronic pain: Secondary | ICD-10-CM

## 2022-11-09 DIAGNOSIS — M1711 Unilateral primary osteoarthritis, right knee: Secondary | ICD-10-CM

## 2022-11-21 ENCOUNTER — Ambulatory Visit (INDEPENDENT_AMBULATORY_CARE_PROVIDER_SITE_OTHER): Payer: 59 | Admitting: Family Medicine

## 2022-11-21 ENCOUNTER — Encounter: Payer: 59 | Admitting: Family Medicine

## 2022-11-21 ENCOUNTER — Other Ambulatory Visit: Payer: Self-pay

## 2022-11-21 ENCOUNTER — Encounter: Payer: Self-pay | Admitting: Family Medicine

## 2022-11-21 VITALS — BP 126/78 | HR 74 | Ht 61.0 in | Wt 211.0 lb

## 2022-11-21 DIAGNOSIS — M1711 Unilateral primary osteoarthritis, right knee: Secondary | ICD-10-CM | POA: Diagnosis not present

## 2022-11-21 DIAGNOSIS — M25562 Pain in left knee: Secondary | ICD-10-CM

## 2022-11-21 DIAGNOSIS — Z96652 Presence of left artificial knee joint: Secondary | ICD-10-CM

## 2022-11-21 DIAGNOSIS — G8929 Other chronic pain: Secondary | ICD-10-CM | POA: Diagnosis not present

## 2022-11-21 DIAGNOSIS — G43809 Other migraine, not intractable, without status migrainosus: Secondary | ICD-10-CM

## 2022-11-21 MED ORDER — SUMATRIPTAN SUCCINATE 100 MG PO TABS: ORAL_TABLET | ORAL | 1 refills | Status: DC

## 2022-11-21 MED ORDER — SUMATRIPTAN SUCCINATE 100 MG PO TABS
100.0000 mg | ORAL_TABLET | Freq: Once | ORAL | 3 refills | Status: DC | PRN
Start: 2022-11-21 — End: 2022-11-21

## 2022-11-21 MED ORDER — MELOXICAM 7.5 MG PO TABS: 7.5000 mg | ORAL_TABLET | Freq: Every day | ORAL | 0 refills | Status: DC | PRN

## 2022-11-21 NOTE — Telephone Encounter (Signed)
Please review. Pt was in our office today. Pt needs a refill on medication.  KP

## 2022-11-22 ENCOUNTER — Encounter: Payer: Self-pay | Admitting: Family Medicine

## 2022-11-22 DIAGNOSIS — G8929 Other chronic pain: Secondary | ICD-10-CM | POA: Insufficient documentation

## 2022-11-22 NOTE — Assessment & Plan Note (Signed)
Patient presents for evaluation of chronic anterior knee pain in the setting of underlying total knee arthroplasty, date of surgery 01/15/2019.  She describes never fully noting significant pain control, did participate with physical therapy, significant affecting ADLs primarily walking on incline/decline, flexion of knee.  Examination without gross malalignment, no abnormalities, no effusion, range of motion consistent with surgical history, shows marked focal tenderness at the lateral more so than medial patellar facet, no laxity with anterior/posterior, varus/valgus stressing.  We reviewed her x-rays that do not demonstrate any marked concern for hardware failure, there is a prominent lateral patellar facet exophyte.  Patient's clinical history and findings are most consistent with pain stemming from this prominent patellar exophytic ossific density.  We did review nonsurgical treatments such as genicular nerve/artery ablation, as well as possible surgical interventions at the discretion of her orthopedic surgeon.  Plan: - Place referral for patient return to her previous orthopedic surgeon at [facility] - Patient can contact us if wishing to proceed with nonsurgical management options

## 2022-11-22 NOTE — Progress Notes (Signed)
Primary Care / Sports Medicine Office Visit  Patient Information:  Patient ID: Diana Mcneil, female DOB: 13-Oct-1945 Age: 77 y.o. MRN: [id number]   Diana Mcneil is a pleasant 77 y.o. female presenting with the following:  Chief Complaint  Patient presents with   Knee Pain    Vitals:   11/21/22 1356  BP: 126/78  Pulse: 74  SpO2: 97%   Vitals:   11/21/22 1356  Weight: 211 lb (95.7 kg)  Height: 5\' 1"  (1.549 m)   Body mass index is 39.87 kg/m.     Independent interpretation of notes and tests performed by another provider:   Independently reviewed x-ray findings with patient and her husband, see below for details.  Procedures performed:   None  Pertinent History, Exam, Impression, and Recommendations:   Problem List Items Addressed This Visit       Musculoskeletal and Integument   Primary osteoarthritis of right knee    Chronic right knee pain in the setting of severe osteoarthritis with focality to the lateral tibiofemoral joint space and patellofemoral articulation.  There is an element of compensatory pain given her more prominent left knee pain. See additional assessment(s) for plan details.  Examination without abnormalities to inspection, trace effusion, limited flexion due to pain, no laxity with anterior/posterior drawer and varus/valgus stressing, prominent crepitus during passive flexion/extension.  Patient has right knee pain due to underlying tricompartmental degenerative changes, reviewed both conservative and surgical treatment strategies.  Plan: - Start meloxicam once daily on an as-needed basis for knee pain (take with food) - Stop this medication if any stomach symptoms develop and contact us for alternate options - Start home exercises and focus on gradual advance using information provided - If pain persist despite the above, contact our office to discuss next steps - If recalcitrant symptoms noted, escalation to intra-articular  corticosteroid to be considered as can viscosupplementation, formal physical therapy, genicular nerve/artery block/ablation.      Relevant Medications   meloxicam (MOBIC) 7.5 MG tablet     Other   Chronic knee pain after total replacement of left knee joint - Primary    Patient presents for evaluation of chronic anterior knee pain in the setting of underlying total knee arthroplasty, date of surgery 01/15/2019.  She describes never fully noting significant pain control, did participate with physical therapy, significant affecting ADLs primarily walking on incline/decline, flexion of knee.  Examination without gross malalignment, no abnormalities, no effusion, range of motion consistent with surgical history, shows marked focal tenderness at the lateral more so than medial patellar facet, no laxity with anterior/posterior, varus/valgus stressing.  We reviewed her x-rays that do not demonstrate any marked concern for hardware failure, there is a prominent lateral patellar facet exophyte.  Patient's clinical history and findings are most consistent with pain stemming from this prominent patellar exophytic ossific density.  We did review nonsurgical treatments such as genicular nerve/artery ablation, as well as possible surgical interventions at the discretion of her orthopedic surgeon.  Plan: - Place referral for patient return to her previous orthopedic surgeon at [facility] - Patient can contact us if wishing to proceed with nonsurgical management options      Relevant Medications   meloxicam (MOBIC) 7.5 MG tablet   Other Relevant Orders   Ambulatory referral to Orthopedic Surgery     Orders & Medications Medications:  Meds ordered this encounter  Medications   meloxicam (MOBIC) 7.5 MG tablet    Sig: Take 1 tablet (7.5 mg total) by  mouth daily as needed for pain.    Dispense:  30 tablet    Refill:  0   Orders Placed This Encounter  Procedures   Ambulatory referral to Orthopedic  Surgery     No follow-ups on file.     Jerrol Banana, MD, [facility]   Primary Care Sports Medicine Primary Care and Sports Medicine at [facility]

## 2022-11-22 NOTE — Patient Instructions (Signed)
-   Referral coordinator will contact to schedule visit with your previous orthopedic surgeon to discuss left knee - Can contact us if wanting to proceed with nerve procedure to control knee pain - Start meloxicam once daily on an as-needed basis for knee pain (take with food) - Stop this medication if any stomach symptoms develop and contact us for alternate options - Start home exercises and focus on gradual advance using information provided - If pain persist despite the above, contact our office to discuss next steps

## 2022-11-22 NOTE — Assessment & Plan Note (Signed)
Chronic right knee pain in the setting of severe osteoarthritis with focality to the lateral tibiofemoral joint space and patellofemoral articulation.  There is an element of compensatory pain given her more prominent left knee pain. See additional assessment(s) for plan details.  Examination without abnormalities to inspection, trace effusion, limited flexion due to pain, no laxity with anterior/posterior drawer and varus/valgus stressing, prominent crepitus during passive flexion/extension.  Patient has right knee pain due to underlying tricompartmental degenerative changes, reviewed both conservative and surgical treatment strategies.  Plan: - Start meloxicam once daily on an as-needed basis for knee pain (take with food) - Stop this medication if any stomach symptoms develop and contact us for alternate options - Start home exercises and focus on gradual advance using information provided - If pain persist despite the above, contact our office to discuss next steps - If recalcitrant symptoms noted, escalation to intra-articular corticosteroid to be considered as can viscosupplementation, formal physical therapy, genicular nerve/artery block/ablation.

## 2022-11-29 ENCOUNTER — Ambulatory Visit: Payer: 59 | Admitting: Family Medicine

## 2022-11-29 VITALS — BP 116/64 | HR 73 | Ht 61.0 in | Wt 211.0 lb

## 2022-11-29 DIAGNOSIS — Z96652 Presence of left artificial knee joint: Secondary | ICD-10-CM | POA: Diagnosis not present

## 2022-11-29 DIAGNOSIS — N750 Cyst of Bartholin's gland: Secondary | ICD-10-CM

## 2022-11-29 DIAGNOSIS — G8929 Other chronic pain: Secondary | ICD-10-CM | POA: Diagnosis not present

## 2022-11-29 DIAGNOSIS — M25562 Pain in left knee: Secondary | ICD-10-CM | POA: Diagnosis not present

## 2022-11-29 DIAGNOSIS — M25561 Pain in right knee: Secondary | ICD-10-CM | POA: Diagnosis not present

## 2022-11-29 DIAGNOSIS — N764 Abscess of vulva: Secondary | ICD-10-CM

## 2022-11-29 DIAGNOSIS — B379 Candidiasis, unspecified: Secondary | ICD-10-CM

## 2022-11-29 MED ORDER — AMOXICILLIN-POT CLAVULANATE 875-125 MG PO TABS
1.0000 | ORAL_TABLET | Freq: Two times a day (BID) | ORAL | 0 refills | Status: DC
Start: 2022-11-29 — End: 2023-02-05

## 2022-11-29 MED ORDER — FLUCONAZOLE 150 MG PO TABS: ORAL_TABLET | ORAL | 0 refills | Status: DC

## 2022-11-29 MED ORDER — DOXYCYCLINE HYCLATE 100 MG PO TABS
100.0000 mg | ORAL_TABLET | Freq: Two times a day (BID) | ORAL | 0 refills | Status: DC
Start: 2022-11-29 — End: 2023-02-05

## 2022-11-29 NOTE — Progress Notes (Signed)
Subjective:    Patient ID: Diana Mcneil, female    DOB: November 03, 1945, 77 y.o.   MRN: [id number]  Diana Mcneil is a 77 y.o. female presenting on 11/29/2022 for Knee Pain and Cyst (Hard lumps on genitals, discovered a few months ago, sometimes itchy and burning, sometimes with swelling, not really changing since they were discovered)   HPI  Bartholin Gland Cyst vs Abscess Reports symptoms onset 2+ months ago, she describes Left side she has a labial cyst or gland that is swollen and hard firm like a marble, and R side has a smaller knot. - Admits symptoms with itching burning. She has tried topical Vagisil  No drainage Denies fever. No significant pain but has swelling   Chronic Knee Pain f/u S/p L Knee TKR Previously managed by [facility] Dr Rosalita Chessman in [place] Has seen [facility] in [place] for 2nd opinion and ultimately advised to return to Dr Rosalita Chessman, she was referred and now waiting on apt scheduling.      10/03/2022    4:15 PM 08/25/2022    2:37 PM 01/16/2022    4:34 PM  Depression screen PHQ 2/9  Decreased Interest 1 0 1  Down, Depressed, Hopeless 0 1 1  PHQ - 2 Score 1 1 2   Altered sleeping 2 0 1  Tired, decreased energy 3 1 3   Change in appetite 0 0 1  Feeling bad or failure about yourself  0 0 0  Trouble concentrating 0 0 0  Moving slowly or fidgety/restless 3 0 3  Suicidal thoughts 0 0 0  PHQ-9 Score 9 2 10   Difficult doing work/chores Somewhat difficult Not difficult at all Extremely dIfficult    Social History   Tobacco Use   Smoking status: Former    Current packs/day: 0.50    Average packs/day: 0.5 packs/day for 40.0 years (20.0 ttl pk-yrs)    Types: Cigarettes   Smokeless tobacco: Former  Building services engineer status: Never Used  Substance Use Topics   Alcohol use: Never   Drug use: Never    Review of Systems Per HPI unless specifically indicated above     Objective:    BP 116/64   Pulse 73   Ht 5\' 1"  (1.549 m)    Wt 211 lb (95.7 kg)   SpO2 96%   BMI 39.87 kg/m   Wt Readings from Last 3 Encounters:  11/29/22 211 lb (95.7 kg)  11/21/22 211 lb (95.7 kg)  10/03/22 215 lb (97.5 kg)    Physical Exam Vitals and nursing note reviewed.  Constitutional:      General: She is not in acute distress.    Appearance: Normal appearance. She is well-developed. She is obese. She is not diaphoretic.     Comments: Well-appearing, comfortable, cooperative  HENT:     Head: Normocephalic and atraumatic.  Eyes:     General:        Right eye: No discharge.        Left eye: No discharge.     Conjunctiva/sclera: Conjunctivae normal.  Cardiovascular:     Rate and Rhythm: Normal rate.  Pulmonary:     Effort: Pulmonary effort is normal.  Genitourinary:    Comments: External genital exam chaperoned by Shirley Muscat CMA  External view of labia shows two locations of swollen tissue with possible cyst vs abscess, R and L side of labia, L with smaller abscess but appears possible may be amenable to spontaneous drainage Skin:  General: Skin is warm and dry.     Findings: No erythema or rash.  Neurological:     Mental Status: She is alert and oriented to person, place, and time.  Psychiatric:        Mood and Affect: Mood normal.        Behavior: Behavior normal.        Thought Content: Thought content normal.     Comments: Well groomed, good eye contact, normal speech and thoughts    Results for orders placed or performed in visit on 06/07/22  Vitamin B12  Result Value Ref Range   Vitamin B-12 1,306 (H) 200 - 1,100 pg/mL  VITAMIN D 25 Hydroxy (Vit-D Deficiency, Fractures)  Result Value Ref Range   Vit D, 25-Hydroxy 66 30 - 100 ng/mL  TSH  Result Value Ref Range   TSH 2.08 0.40 - 4.50 mIU/L  Hemoglobin A1c  Result Value Ref Range   Hgb A1c MFr Bld 5.2 <5.7 % of total Hgb   Mean Plasma Glucose 103 mg/dL   eAG (mmol/L) 5.7 mmol/L  Lipid panel  Result Value Ref Range   Cholesterol 144 <200 mg/dL   HDL 59 >  OR = 50 mg/dL   Triglycerides 93 <742 mg/dL   LDL Cholesterol (Calc) 67 mg/dL (calc)   Total CHOL/HDL Ratio 2.4 <5.0 (calc)   Non-HDL Cholesterol (Calc) 85 <595 mg/dL (calc)  CBC with Differential/Platelet  Result Value Ref Range   WBC 5.2 3.8 - 10.8 Thousand/uL   RBC 4.54 3.80 - 5.10 Million/uL   Hemoglobin 14.0 11.7 - 15.5 g/dL   HCT 63.8 75.6 - 43.3 %   MCV 93.4 80.0 - 100.0 fL   MCH 30.8 27.0 - 33.0 pg   MCHC 33.0 32.0 - 36.0 g/dL   RDW 29.5 18.8 - 41.6 %   Platelets 179 140 - 400 Thousand/uL   MPV 11.6 7.5 - 12.5 fL   Neutro Abs 3,177 1,500 - 7,800 cells/uL   Lymphs Abs 1,487 850 - 3,900 cells/uL   Absolute Monocytes 416 200 - 950 cells/uL   Eosinophils Absolute 88 15 - 500 cells/uL   Basophils Absolute 31 0 - 200 cells/uL   Neutrophils Relative % 61.1 %   Total Lymphocyte 28.6 %   Monocytes Relative 8.0 %   Eosinophils Relative 1.7 %   Basophils Relative 0.6 %  COMPLETE METABOLIC PANEL WITH GFR  Result Value Ref Range   Glucose, Bld 92 65 - 139 mg/dL   BUN 8 7 - 25 mg/dL   Creat 6.06 (L) 3.01 - 1.00 mg/dL   eGFR 94 > OR = 60 SW/FUX/3.23F5   BUN/Creatinine Ratio 14 6 - 22 (calc)   Sodium 140 135 - 146 mmol/L   Potassium 4.1 3.5 - 5.3 mmol/L   Chloride 104 98 - 110 mmol/L   CO2 23 20 - 32 mmol/L   Calcium 9.0 8.6 - 10.4 mg/dL   Total Protein 6.3 6.1 - 8.1 g/dL   Albumin 3.9 3.6 - 5.1 g/dL   Globulin 2.4 1.9 - 3.7 g/dL (calc)   AG Ratio 1.6 1.0 - 2.5 (calc)   Total Bilirubin 0.8 0.2 - 1.2 mg/dL   Alkaline phosphatase (APISO) 80 37 - 153 U/L   AST 16 10 - 35 U/L   ALT 7 6 - 29 U/L      Assessment & Plan:   Problem List Items Addressed This Visit     Chronic knee pain after total replacement of left knee joint  Other Visit Diagnoses     Bartholin gland cyst    -  Primary   Relevant Medications   amoxicillin-clavulanate (AUGMENTIN) 875-125 MG tablet   doxycycline (VIBRA-TABS) 100 MG tablet   Other Relevant Orders   Ambulatory referral to Obstetrics /  Gynecology   S/P TKR (total knee replacement), left       Chronic pain of both knees       Labial abscess       Relevant Medications   doxycycline (VIBRA-TABS) 100 MG tablet   Antibiotic-induced yeast infection       Relevant Medications   fluconazole (DIFLUCAN) 150 MG tablet       I am concerned there is an abscess or infection on both sides.  Start taking Doxycycline antibiotic 100mg  twice daily for 10 days. Take with full glass of water and stay upright for at least 30 min after taking, may be seated or standing, but should NOT lay down. This is just a safety precaution, if this medicine does not go all the way down throat well it could cause some burning discomfort to throat and esophagus.  Augmentin twice a day for 10 days  AFTER antibiotics you can take Diflucan for yeast infection if it develops after the antibiotics.  You can do a [employer] in warm soapy water to help it heal and it may drain some pus.  Referral to GYN They will contact you with an appointment. To treat this further if it does not resolve  ---------------------   Referral is currently still in process, it was submitted 11/22/22  Newt Lukes, M.D. [place], [place]  [phone]  If you don't hear back from Dr Lutricia Feil office by Venetia Maxon 10/30, then please give them a call and check status.  Orders Placed This Encounter  Procedures   Ambulatory referral to Obstetrics / Gynecology    Referral Priority:   Routine    Referral Type:   Consultation    Referral Reason:   Specialty Services Required    Requested Specialty:   Obstetrics and Gynecology    Number of Visits Requested:   1     Meds ordered this encounter  Medications   amoxicillin-clavulanate (AUGMENTIN) 875-125 MG tablet    Sig: Take 1 tablet by mouth 2 (two) times daily.    Dispense:  20 tablet    Refill:  0   doxycycline (VIBRA-TABS) 100 MG tablet    Sig: Take 1 tablet (100 mg total) by mouth 2 (two) times daily. For 10 days. Take with  full glass of water, stay upright 30 min after taking.    Dispense:  20 tablet    Refill:  0   fluconazole (DIFLUCAN) 150 MG tablet    Sig: Take one tablet by mouth on Day 1. Repeat dose 2nd tablet on Day 3.    Dispense:  2 tablet    Refill:  0      Follow up plan: Return if symptoms worsen or fail to improve.   Saralyn Pilar, DO [facility] 11/29/2022, 2:36 PM

## 2022-11-29 NOTE — Patient Instructions (Addendum)
Thank you for coming to the office today.  I am concerned there is an abscess or infection on both sides.  Start taking Doxycycline antibiotic 100mg  twice daily for 10 days. Take with full glass of water and stay upright for at least 30 min after taking, may be seated or standing, but should NOT lay down. This is just a safety precaution, if this medicine does not go all the way down throat well it could cause some burning discomfort to throat and esophagus.  Augmentin twice a day for 10 days  AFTER antibiotics you can take Diflucan for yeast infection if it develops after the antibiotics.  You can do a [employer] in warm soapy water to help it heal and it may drain some pus.  The GYN Surgeon will contact you with an appointment. To treat this further if it does not resolve  ---------------------   Referral is currently still in process, it was submitted 11/22/22  Newt Lukes, M.D. [place], [place]  [phone]  If you don't hear back from Dr Lutricia Feil office by Venetia Maxon 10/30, then please give them a call and check status.   Bartholin's Cyst  A Bartholin's cyst is a fluid-filled sac that forms on a Bartholin's gland. Bartholin's glands are small glands in the folds of skin near the opening of the vagina (labia). This type of cyst causes a bulge or lump near the opening of the vagina. If you have a cyst that is small and not infected, you may be able to take care of it at home. If your cyst gets infected, it may cause pain and your doctor may need to drain it. What are the causes? This condition may be caused by a blocked Bartholin's gland. Germs (bacteria) inside of the cyst can cause an infection. What are the signs or symptoms? A bulge or lump near the opening of the vagina. Discomfort or pain. Redness, swelling, or fluid draining from the area. How is this treated? You may not need treatment if your cyst is not causing symptoms. The cyst can go away on its own with home care.  Home care includes hot baths or heat therapy. Large cysts or cysts that are infected may be treated with: Antibiotic medicine. A procedure to drain the fluid. Cysts that keep coming back will need to be drained many times. Your doctor may talk to you about surgery to remove the cyst. Follow these instructions at home: Medicines Take over-the-counter and prescription medicines only as told by your doctor. If you were prescribed an antibiotic medicine, take it as told by your doctor. Do not stop taking it even if you start to feel better. Managing pain and swelling Try sitz baths to help with pain and swelling. A sitz bath is a warm water bath in which the water only comes up to your hips and should cover your buttocks. You may take sitz baths a few times a day. If told, put heat on the affected area as often as needed. Use the heat source that your doctor recommends, such as a moist heat pack or a heating pad. Place a towel between your skin and the heat source. Leave the heat on for 20-30 minutes. Take off the heat if your skin turns bright red. This is very important. If you cannot feel pain, heat, or cold, you have a greater risk of getting burned. General instructions If your cyst was drained: Follow instructions from your doctor about how to take care of your wound.  Use feminine pads to absorb any fluid. Do not push on or squeeze your cyst. Do not have sex until the cyst has gone away or your wound from drainage has healed. Take these steps to help prevent a cyst from returning, and to prevent other cysts from forming: Take a bath or shower once a day. Clean the area around your vagina with mild soap and water when you bathe. Practice safe sex to prevent STIs. Talk with your doctor about how to prevent STIs and which forms of birth control to use. Keep all follow-up visits. Contact a doctor if: You have a fever. You get more redness, swelling, or pain around your cyst. You have fluid,  blood, pus, or a bad smell coming from your cyst. You have a cyst that gets larger or a cyst that comes back. Summary A Bartholin's cyst is a fluid-filled sac that forms on a Bartholin's gland. These small glands are found in the folds of skin near the opening of the vagina (labia). This type of cyst causes a bulge or lump near the opening of the vagina. Try sitz baths a few times a day to help with pain and swelling. Do not push on or squeeze your cyst. This information is not intended to replace advice given to you by your health care provider. Make sure you discuss any questions you have with your health care provider. Document Revised: 06/23/2019 Document Reviewed: 06/23/2019 Elsevier Patient Education  2024 Elsevier Inc.   Please schedule a Follow-up Appointment to: Return if symptoms worsen or fail to improve.  If you have any other questions or concerns, please feel free to call the office or send a message through MyChart. You may also schedule an earlier appointment if necessary.  Additionally, you may be receiving a survey about your experience at our office within a few days to 1 week by e-mail or mail. We value your feedback.  Saralyn Pilar, DO [facility], [place]

## 2022-12-04 ENCOUNTER — Ambulatory Visit
Admission: RE | Admit: 2022-12-04 | Discharge: 2022-12-04 | Disposition: A | Discharge status: Home / Self Care | Payer: 59 | Source: Ambulatory Visit | Attending: Family Medicine | Admitting: Family Medicine

## 2022-12-04 DIAGNOSIS — M810 Age-related osteoporosis without current pathological fracture: Secondary | ICD-10-CM | POA: Diagnosis not present

## 2022-12-14 ENCOUNTER — Ambulatory Visit: Payer: 59 | Admitting: Dermatology

## 2022-12-18 ENCOUNTER — Ambulatory Visit (INDEPENDENT_AMBULATORY_CARE_PROVIDER_SITE_OTHER): Payer: 59 | Admitting: Obstetrics

## 2022-12-18 ENCOUNTER — Encounter: Payer: Self-pay | Admitting: Obstetrics

## 2022-12-18 VITALS — BP 121/75 | HR 73 | Ht 61.0 in | Wt 206.0 lb

## 2022-12-18 DIAGNOSIS — I863 Vulval varices: Secondary | ICD-10-CM

## 2022-12-18 MED ORDER — TRIAMCINOLONE ACETONIDE 0.025 % EX OINT
1.0000 | TOPICAL_OINTMENT | Freq: Two times a day (BID) | CUTANEOUS | 2 refills | Status: DC
Start: 2022-12-18 — End: 2023-09-19

## 2022-12-18 NOTE — Progress Notes (Signed)
GYNECOLOGY PROGRESS NOTE  Subjective:  PCP: Smitty Cords, DO   Patient ID: Diana Mcneil, female    DOB: 1945-12-05, 77 y.o.   MRN: [id number]  Chief Complaint  Patient presents with   Bartholin gland cyst   HPI  Patient is a 77 y.o. [id number] female who presents for cysts on her labia for the past 2 mos, they are itchy and at times, painful. Have started out small and gradually increased in size. Has not looked at them herself, but can feel them. Does have hx of varicose veins and hemorrhoids, sometimes sits on the toilet for prolonged periods of time.   The following portions of the patient's history were reviewed and updated as appropriate: allergies, current medications, past family history, past medical history, past social history, past surgical history, and problem list.  Past Medical History:  Diagnosis Date   Allergy    Anxiety    Headache    Hyperlipidemia    Migraine    Sleep apnea    Past Surgical History:  Procedure Laterality Date   ABDOMINAL HYSTERECTOMY     partial   BACK SURGERY  1989   BREAST BIOPSY Left 08/19/2020   Affirm bx-"X" clip-path pending   HERNIA REPAIR  09/13/2013   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   Family History  Problem Relation Age of Onset   Diabetes Mother    Heart disease Mother    Stroke Mother    CAD Mother    Diabetes Father    Diabetes Paternal Grandfather    Social History   Socioeconomic History   Marital status: Single    Spouse name: Not on file   Number of children: Not on file   Years of education: high school   Highest education level: High school graduate  Occupational History   Not on file  Tobacco Use   Smoking status: Former    Current packs/day: 0.50    Average packs/day: 0.5 packs/day for 40.0 years (20.0 ttl pk-yrs)    Types: Cigarettes   Smokeless tobacco: Former  Building services engineer status: Never Used  Substance and Sexual Activity   Alcohol use: Never   Drug use: Never    Sexual activity: Yes  Other Topics Concern   Not on file  Social History Narrative   Not on file   Social Determinants of Health   Financial Resource Strain: Low Risk  (08/25/2022)   Overall Financial Resource Strain (CARDIA)    Difficulty of Paying Living Expenses: Not hard at all  Food Insecurity: No Food Insecurity (08/25/2022)   Hunger Vital Sign    Worried About Running Out of Food in the Last Year: Never true    Ran Out of Food in the Last Year: Never true  Transportation Needs: No Transportation Needs (08/25/2022)   PRAPARE - Administrator, Civil Service (Medical): No    Lack of Transportation (Non-Medical): No  Physical Activity: Insufficiently Active (08/25/2022)   Exercise Vital Sign    Days of Exercise per Week: 4 days    Minutes of Exercise per Session: 20 min  Stress: No Stress Concern Present (08/25/2022)   [employer] of Occupational Health - Occupational Stress Questionnaire    Feeling of Stress : Only a little  Social Connections: Socially Isolated (08/25/2022)   Social Connection and Isolation Panel [NHANES]    Frequency of Communication with Friends and Family: Once a week    Frequency of Social Gatherings with Friends  and Family: Never    Attends Religious Services: Never    Active Member of Clubs or Organizations: No    Attends Banker Meetings: Never    Marital Status: Living with partner  Intimate Partner Violence: Not At Risk (08/25/2022)   Humiliation, Afraid, Rape, and Kick questionnaire    Fear of Current or Ex-Partner: No    Emotionally Abused: No    Physically Abused: No    Sexually Abused: No   Current Outpatient Medications on File Prior to Visit  Medication Sig Dispense Refill   alendronate (FOSAMAX) 70 MG tablet TAKE 1 TABLET BY MOUTH WEEKLY  WITH 8 OZ OF PLAIN WATER 30  MINUTES BEFORE FIRST FOOD, DRINK OR MEDS. STAY UPRIGHT FOR 30  MINS 12 tablet 3   amoxicillin-clavulanate (AUGMENTIN) 875-125 MG tablet Take 1  tablet by mouth 2 (two) times daily. 20 tablet 0   ascorbic acid (VITAMIN C) 500 MG tablet Take by mouth.     aspirin 81 MG EC tablet Take by mouth.     azelastine (ASTELIN) 0.1 % nasal spray Place 2 sprays into both nostrils 2 (two) times daily. Use in each nostril as directed 90 mL 3   Cholecalciferol 25 MCG (1000 UT) tablet Take by mouth.     doxycycline (VIBRA-TABS) 100 MG tablet Take 1 tablet (100 mg total) by mouth 2 (two) times daily. For 10 days. Take with full glass of water, stay upright 30 min after taking. 20 tablet 0   DULoxetine (CYMBALTA) 30 MG capsule Take 1 capsule (30 mg total) by mouth 2 (two) times daily. 200 capsule 3   fluconazole (DIFLUCAN) 150 MG tablet Take one tablet by mouth on Day 1. Repeat dose 2nd tablet on Day 3. 2 tablet 0   fluticasone (FLONASE) 50 MCG/ACT nasal spray Place 2 sprays into both nostrils daily. 48 g 3   gabapentin (NEURONTIN) 100 MG capsule Take 1 capsule (100 mg total) by mouth 2 (two) times daily. 200 capsule 3   meloxicam (MOBIC) 7.5 MG tablet Take 1 tablet (7.5 mg total) by mouth daily as needed for pain. 30 tablet 0   montelukast (SINGULAIR) 10 MG tablet TAKE 1 TABLET BY MOUTH AT  BEDTIME 100 tablet 2   nystatin (MYCOSTATIN/NYSTOP) powder Apply 1 Application topically 3 (three) times daily. For 2-4 weeks. May repeat if needed. 30 g 2   pantoprazole (PROTONIX) 40 MG tablet TAKE 1 TABLET BY MOUTH DAILY  BEFORE BREAKFAST 100 tablet 2   rosuvastatin (CRESTOR) 20 MG tablet TAKE 1 TABLET BY MOUTH AT  BEDTIME 100 tablet 2   SUMAtriptan (IMITREX) 100 MG tablet Take 1 tablet (100 mg total) by mouth once as needed for migraine. May repeat one dose in 2 hours if headache persists, for max dose 24 hours 36 tablet 1   triamcinolone ointment (KENALOG) 0.5 % Apply 1 Application topically 2 (two) times daily. For 1-2 weeks affected area only then stop. 30 g 0   No current facility-administered medications on file prior to visit.   Allergies  Allergen Reactions    Black Pepper-Turmeric     Bell peppers, not black pepper   Mushroom Extract Complex Swelling   Sulfa Antibiotics Hives and Swelling   Review of Systems Pertinent items are noted in HPI.   Objective:   Blood pressure 121/75, pulse 73, height 5\' 1"  (1.549 m), weight 206 lb (93.4 kg). Body mass index is 38.92 kg/m.  General appearance: alert, cooperative, and morbidly obese Abdomen: soft, non-tender;  bowel sounds normal; no masses,  no organomegaly Pelvic:  bilateral labia with varicosities, reducible with pressure. Bartholin's glands bilaterally not inflamed.  No thrombosis or clots appreciated.  Extremities: edema noted above pt's compression stockings, and varicose veins noted Neurologic: Grossly normal  Assessment/Plan:   1. Vulvar varicose veins   77yo G2P2002 with 2mos of symptomatic vulvar varicosities, referred by her PCP. Discussed diagnosis today and treatment options, with best treatment option based on data being sclerotherapy. This would be complicated by her history of lower extremity varicose veins, which often times need to be treated first, and then reassessing the response of the vulvar varices. Also discussed relieving pressure on her vulvar area to help with symptoms, including adequate treatment of her constipation so she is not sitting on the toilet for prolonged periods of time. Pt does not feel comfortable with a procedure and recovery at this time. There is limited data on topical steroid therapy to potentially help shrink the varicosities, but we discussed this does help some patients with partial relief of symptoms and she wishes to try. -Rx for Triamcinolone BID to affected area -Follow up prn    Julieanne Manson, DO [facility]

## 2022-12-27 ENCOUNTER — Telehealth: Payer: Self-pay

## 2022-12-27 NOTE — Telephone Encounter (Signed)
Pt calling; saw MF on the 11th; she was to send in a rx for her; local pharm hasn't recv'd rx.  Adv pt rx was sent in that morning but to [facility] instead of local pharm.  Pt has recv'd med from [facility].

## 2022-12-29 ENCOUNTER — Telehealth: Payer: Self-pay

## 2022-12-29 NOTE — Telephone Encounter (Signed)
The 0.5 dose was only made to be taken for 1 to 2 weeks.  She should be taking 0.025% twice daily as needed.

## 2022-12-29 NOTE — Telephone Encounter (Signed)
Patient called and left message to inform you that her Ortho doctor is no longer available and wanted to clarify Triamcinolone ointment dose. The one you previously prescribed is different from the current dose she was given. Thanks!

## 2023-02-05 ENCOUNTER — Encounter: Payer: Self-pay | Admitting: Family Medicine

## 2023-02-05 ENCOUNTER — Ambulatory Visit (INDEPENDENT_AMBULATORY_CARE_PROVIDER_SITE_OTHER): Payer: 59 | Admitting: Family Medicine

## 2023-02-05 VITALS — BP 124/78 | HR 74 | Ht 61.0 in | Wt 207.0 lb

## 2023-02-05 DIAGNOSIS — L659 Nonscarring hair loss, unspecified: Secondary | ICD-10-CM | POA: Diagnosis not present

## 2023-02-05 DIAGNOSIS — M25562 Pain in left knee: Secondary | ICD-10-CM

## 2023-02-05 DIAGNOSIS — G8929 Other chronic pain: Secondary | ICD-10-CM | POA: Diagnosis not present

## 2023-02-05 DIAGNOSIS — Z96652 Presence of left artificial knee joint: Secondary | ICD-10-CM | POA: Diagnosis not present

## 2023-02-05 DIAGNOSIS — G43909 Migraine, unspecified, not intractable, without status migrainosus: Secondary | ICD-10-CM | POA: Diagnosis not present

## 2023-02-05 MED ORDER — NURTEC 75 MG PO TBDP
75.0000 mg | ORAL_TABLET | ORAL | 2 refills | Status: DC
Start: 2023-02-05 — End: 2023-03-06

## 2023-02-05 NOTE — Progress Notes (Signed)
Subjective:    Patient ID: Diana Mcneil, female    DOB: 13-Jan-1946, 77 y.o.   MRN: [id number]  Diana Mcneil is a 77 y.o. female presenting on 02/05/2023 for Medical Management of Chronic Issues   HPI  Discussed the use of AI scribe software for clinical note transcription with the patient, who gave verbal consent to proceed.  History of Present Illness     GYN update She reports that the previously suspected cyst was diagnosed as varicose veins, which she was informed would persist despite surgical intervention. The patient has been using a prescribed cream for symptom management, which she anticipates needing to reorder regularly.  Left Knee Pain, Chronic S/p TKR Left Followed by Ortho previously for surgery Now recently has been managed by Sports med Dr Ashley Royalty, they have offered non surgical option for pain to target nerves. She is interested to pursue this. Unable to return to previous [facility] provider Dr Newt Lukes, no longer practicing.  The patient also mentions a bone spur in the knee, expressing concern about potential complications and questioning whether it should be surgically removed.  Episodic Migraines The patient also reports experiencing severe headaches and questions whether the dosage of her current medication, sumatriptan, could be increased. On Sumatriptan 100mg  AS NEEDED abortive therapy, has frequent increased migraine days per week now.  Lastly, the patient expresses concern about hair loss that has been occurring over the past three to four months. She has been taking biotin and vitamin D supplements but has not noticed significant improvement. The patient is interested in understanding potential causes and exploring treatment options.         02/05/2023    3:49 PM 10/03/2022    4:15 PM 08/25/2022    2:37 PM  Depression screen PHQ 2/9  Decreased Interest 0 1 0  Down, Depressed, Hopeless 1 0 1  PHQ - 2 Score 1 1 1   Altered sleeping 1 2 0   Tired, decreased energy 1 3 1   Change in appetite 0 0 0  Feeling bad or failure about yourself  0 0 0  Trouble concentrating 0 0 0  Moving slowly or fidgety/restless 1 3 0  Suicidal thoughts 0 0 0  PHQ-9 Score 4 9 2   Difficult doing work/chores  Somewhat difficult Not difficult at all       02/05/2023    3:49 PM 10/03/2022    4:15 PM 01/16/2022    4:35 PM 03/30/2021    1:52 PM  GAD 7 : Generalized Anxiety Score  Nervous, Anxious, on Edge 1 0 0 0  Control/stop worrying 2 3 3 1   Worry too much - different things 2 2 3  0  Trouble relaxing 2 1 1  0  Restless 0 0 0 0  Easily annoyed or irritable 1 1 1  0  Afraid - awful might happen 1 1 0 0  Total GAD 7 Score 9 8 8 1   Anxiety Difficulty  Not difficult at all Somewhat difficult Not difficult at all    Social History   Tobacco Use   Smoking status: Former    Current packs/day: 0.50    Average packs/day: 0.5 packs/day for 40.0 years (20.0 ttl pk-yrs)    Types: Cigarettes   Smokeless tobacco: Former  Building services engineer status: Never Used  Substance Use Topics   Alcohol use: Never   Drug use: Never    Review of Systems Per HPI unless specifically indicated above     Objective:  BP 124/78   Pulse 74   Ht 5\' 1"  (1.549 m)   Wt 207 lb (93.9 kg)   SpO2 96%   BMI 39.11 kg/m   Wt Readings from Last 3 Encounters:  02/05/23 207 lb (93.9 kg)  12/18/22 206 lb (93.4 kg)  11/29/22 211 lb (95.7 kg)    Physical Exam Vitals and nursing note reviewed.  Constitutional:      General: She is not in acute distress.    Appearance: Normal appearance. She is well-developed. She is obese. She is not diaphoretic.     Comments: Well-appearing, comfortable, cooperative  HENT:     Head: Normocephalic and atraumatic.  Eyes:     General:        Right eye: No discharge.        Left eye: No discharge.     Conjunctiva/sclera: Conjunctivae normal.  Cardiovascular:     Rate and Rhythm: Normal rate.  Pulmonary:     Effort: Pulmonary  effort is normal.  Musculoskeletal:     Comments: L knee bulky s/p TKR  Skin:    General: Skin is warm and dry.     Findings: No erythema or rash.     Comments: Generalized hair thinning, without patchy loss  Neurological:     Mental Status: She is alert and oriented to person, place, and time.  Psychiatric:        Mood and Affect: Mood normal.        Behavior: Behavior normal.        Thought Content: Thought content normal.     Comments: Well groomed, good eye contact, normal speech and thoughts     CLINICAL DATA:  Status post left knee replacement in 2020, persistent pain.   EXAM: LEFT KNEE - COMPLETE 4+ VIEW   COMPARISON:  None Available.   FINDINGS: Left knee arthroplasty in expected alignment. No periprosthetic lucency. No acute or periprosthetic fracture. Prior patellar resurfacing. 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 No erosive change. No knee joint effusion.   IMPRESSION: 1. Left knee arthroplasty without complication. 2. 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     Electronically Signed   By: Narda Rutherford M.D.   On: 11/05/2022 18:31  Results for orders placed or performed in visit on 06/07/22  Vitamin B12   Collection Time: 06/07/22 10:51 AM  Result Value Ref Range   Vitamin B-12 1,306 (H) 200 - 1,100 pg/mL  VITAMIN D 25 Hydroxy (Vit-D Deficiency, Fractures)   Collection Time: 06/07/22 10:51 AM  Result Value Ref Range   Vit D, 25-Hydroxy 66 30 - 100 ng/mL  TSH   Collection Time: 06/07/22 10:51 AM  Result Value Ref Range   TSH 2.08 0.40 - 4.50 mIU/L  Hemoglobin A1c   Collection Time: 06/07/22 10:51 AM  Result Value Ref Range   Hgb A1c MFr Bld 5.2 <5.7 % of total Hgb   Mean Plasma Glucose 103 mg/dL   eAG (mmol/L) 5.7 mmol/L  Lipid panel   Collection Time: 06/07/22 10:51 AM  Result Value Ref Range   Cholesterol 144 <200 mg/dL   HDL 59 > OR = 50 mg/dL   Triglycerides  93 <469 mg/dL   LDL Cholesterol (Calc) 67 mg/dL (calc)   Total CHOL/HDL Ratio 2.4 <5.0 (calc)   Non-HDL Cholesterol (Calc) 85 <629 mg/dL (calc)  CBC with Differential/Platelet   Collection Time: 06/07/22 10:51 AM  Result Value Ref Range   WBC 5.2 3.8 -  10.8 Thousand/uL   RBC 4.54 3.80 - 5.10 Million/uL   Hemoglobin 14.0 11.7 - 15.5 g/dL   HCT 82.9 56.2 - 13.0 %   MCV 93.4 80.0 - 100.0 fL   MCH 30.8 27.0 - 33.0 pg   MCHC 33.0 32.0 - 36.0 g/dL   RDW 86.5 78.4 - 69.6 %   Platelets 179 140 - 400 Thousand/uL   MPV 11.6 7.5 - 12.5 fL   Neutro Abs 3,177 1,500 - 7,800 cells/uL   Lymphs Abs 1,487 850 - 3,900 cells/uL   Absolute Monocytes 416 200 - 950 cells/uL   Eosinophils Absolute 88 15 - 500 cells/uL   Basophils Absolute 31 0 - 200 cells/uL   Neutrophils Relative % 61.1 %   Total Lymphocyte 28.6 %   Monocytes Relative 8.0 %   Eosinophils Relative 1.7 %   Basophils Relative 0.6 %  COMPLETE METABOLIC PANEL WITH GFR   Collection Time: 06/07/22 10:51 AM  Result Value Ref Range   Glucose, Bld 92 65 - 139 mg/dL   BUN 8 7 - 25 mg/dL   Creat 2.95 (L) 2.84 - 1.00 mg/dL   eGFR 94 > OR = 60 XL/KGM/0.10U7   BUN/Creatinine Ratio 14 6 - 22 (calc)   Sodium 140 135 - 146 mmol/L   Potassium 4.1 3.5 - 5.3 mmol/L   Chloride 104 98 - 110 mmol/L   CO2 23 20 - 32 mmol/L   Calcium 9.0 8.6 - 10.4 mg/dL   Total Protein 6.3 6.1 - 8.1 g/dL   Albumin 3.9 3.6 - 5.1 g/dL   Globulin 2.4 1.9 - 3.7 g/dL (calc)   AG Ratio 1.6 1.0 - 2.5 (calc)   Total Bilirubin 0.8 0.2 - 1.2 mg/dL   Alkaline phosphatase (APISO) 80 37 - 153 U/L   AST 16 10 - 35 U/L   ALT 7 6 - 29 U/L      Assessment & Plan:   Problem List Items Addressed This Visit     Chronic knee pain after total replacement of left knee joint - Primary   Episodic migraine   Relevant Medications   NURTEC 75 MG TBDP   Other Visit Diagnoses       S/P TKR (total knee replacement), left         Hair loss            Assessment and Plan     Followed by GYN Varicose Veins Managed with topical cream. No plans for surgical intervention due to recurrent nature of the condition. -Continue using prescribed cream.  Left Knee Pain, chronic S/p L TKR Severe pain due to bone spur and potential nerve involvement. Patient expressed interest in nerve ablation procedure. -Coordinate with Sports Med Dr. Ashley Royalty for scheduling of nerve ablation procedure. -Consider referral to a different orthopedic surgeon if necessary after the procedure.  Episodic Migraine Current treatment with sumatriptan 100mg  not providing sufficient relief. On Preventative therapy already with Duloxetine SNRI and Gabapentin  -Discontinue sumatriptan. -Start Nurtec ODT as needed for migraines, up to 16 tablets per month if approved by insurance. If only 8 are approved, use sparingly and do not mix with sumatriptan.  Hair Loss Generalized hair loss over the past few months. No clear trigger identified. Patient currently taking biotin and vitamin D supplements. -Continue biotin and vitamin D supplements. -Consider referral to dermatology if hair loss continues after 12 months.      Route chart to Dr Ashley Royalty no referral, needs non invasvie procedure   No  orders of the defined types were placed in this encounter.   Meds ordered this encounter  Medications   NURTEC 75 MG TBDP    Sig: Take 1 tablet (75 mg total) by mouth every other day. Max 1 tablet in 24 hours.    Dispense:  16 tablet    Refill:  2    30 day supply, preventative therapy for migraine headache, failed Gabapentin, Duloxetine SNRI previously.    Follow up plan: Return if symptoms worsen or fail to improve.   Saralyn Pilar, DO [facility] 02/05/2023, 3:18 PM

## 2023-02-05 NOTE — Patient Instructions (Addendum)
Thank you for coming to the office today.  We will coordinate with Dr Ashley Royalty on scheduling procedure.  If still need, we can find a different orthopedic.  Ordered Nurtec ODT dissolving for Migraines, take one every other day If only 8 are approved, then take one as needed, prefer every 48 hours if need  Don't mix with Sumatriptan  Keep on Vitamin D and Biotin  Please schedule a Follow-up Appointment to: Return if symptoms worsen or fail to improve.  If you have any other questions or concerns, please feel free to call the office or send a message through MyChart. You may also schedule an earlier appointment if necessary.  Additionally, you may be receiving a survey about your experience at our office within a few days to 1 week by e-mail or mail. We value your feedback.  Saralyn Pilar, DO [facility], [place]

## 2023-02-18 ENCOUNTER — Other Ambulatory Visit: Payer: Self-pay | Admitting: Family Medicine

## 2023-02-18 DIAGNOSIS — E785 Hyperlipidemia, unspecified: Secondary | ICD-10-CM

## 2023-02-20 NOTE — Telephone Encounter (Signed)
 Labs in date  Requested Prescriptions  Pending Prescriptions Disp Refills   rosuvastatin  (CRESTOR ) 20 MG tablet [Pharmacy Med Name: Rosuvastatin  Calcium  20 MG Oral Tablet] 100 tablet 2    Sig: TAKE 1 TABLET BY MOUTH AT  BEDTIME     Cardiovascular:  Antilipid - Statins 2 Failed - 02/20/2023 12:21 PM      Failed - Cr in normal range and within 360 days    Creat  Date Value Ref Range Status  06/07/2022 0.57 (L) 0.60 - 1.00 mg/dL Final         Failed - Lipid Panel in normal range within the last 12 months    Cholesterol  Date Value Ref Range Status  06/07/2022 144 <200 mg/dL Final   LDL Cholesterol (Calc)  Date Value Ref Range Status  06/07/2022 67 mg/dL (calc) Final    Comment:    Reference range: <100 . Desirable range <100 mg/dL for primary prevention;   <70 mg/dL for patients with CHD or diabetic patients  with > or = 2 CHD risk factors. SABRA LDL-C is now calculated using the Martin-Hopkins  calculation, which is a validated novel method providing  better accuracy than the Friedewald equation in the  estimation of LDL-C.  Gladis APPLETHWAITE et al. SANDREA. 7986;689(80): 2061-2068  (http://education.QuestDiagnostics.com/faq/FAQ164)    HDL  Date Value Ref Range Status  06/07/2022 59 > OR = 50 mg/dL Final   Triglycerides  Date Value Ref Range Status  06/07/2022 93 <150 mg/dL Final         Passed - Patient is not pregnant      Passed - Valid encounter within last 12 months    Recent Outpatient Visits           2 weeks ago Chronic knee pain after total replacement of left knee joint   [place] [facility] [place], Marsa PARAS, DO   2 months ago Bartholin gland cyst   [place] [facility] Marsa PARAS, DO   3 months ago Chronic knee pain after total replacement of left knee joint   [place] [facility], Selinda PARAS, MD   4 months ago Morbid obesity with BMI of 40.0-44.9, adult  [facility])   [facility] [place], Marsa PARAS, DO   8 months ago Annual physical exam   [facility] [place], Marsa PARAS, [place]

## 2023-02-27 ENCOUNTER — Telehealth: Payer: Self-pay

## 2023-02-27 NOTE — Telephone Encounter (Signed)
Just submitted prior auth for nurtec electronically with [facility] pharmacies.

## 2023-02-27 NOTE — Telephone Encounter (Signed)
Just received paperwork for patient. Will look over and get completed.

## 2023-02-27 NOTE — Telephone Encounter (Signed)
Copied from CRM [id number]. Topic: Clinical - Medication Question >> Feb 27, 2023 10:12 AM Shon Hale wrote: Reason for CRM: [facility] - [employer] with [employer] calling to check on status of prior authorization for patient's nurtec. [facility] will fax over prior authorization request to office today

## 2023-02-28 ENCOUNTER — Other Ambulatory Visit: Payer: Self-pay | Admitting: Family Medicine

## 2023-02-28 DIAGNOSIS — M810 Age-related osteoporosis without current pathological fracture: Secondary | ICD-10-CM

## 2023-02-28 NOTE — Telephone Encounter (Signed)
Spoke with patient she stated she has more then 4 migraines a month. This is for an authorization on Nurtec.

## 2023-03-01 NOTE — Telephone Encounter (Signed)
Requested medication (s) are due for refill today: Yes  Requested medication (s) are on the active medication list: yes  Last refill:  06/16/22 #12 3 RF  Future visit scheduled: no  Notes to clinic:  no results for Mg or Phos   Requested Prescriptions  Pending Prescriptions Disp Refills   alendronate (FOSAMAX) 70 MG tablet [Pharmacy Med Name: Alendronate Sodium 70 MG Oral Tablet] 12 tablet 3    Sig: TAKE 1 TABLET BY MOUTH WEEKLY  WITH 8 OZ OF PLAIN WATER 30  MINUTES BEFORE FIRST FOOD, DRINK OR MEDS. STAY UPRIGHT FOR 30  MINS     Endocrinology:  Bisphosphonates Failed - 03/01/2023  8:15 AM      Failed - Cr in normal range and within 360 days    Creat  Date Value Ref Range Status  06/07/2022 0.57 (L) 0.60 - 1.00 mg/dL Final         Failed - Mg Level in normal range and within 360 days    No results found for: "MG"       Failed - Phosphate in normal range and within 360 days    No results found for: "PHOS"       Passed - Ca in normal range and within 360 days    Calcium  Date Value Ref Range Status  06/07/2022 9.0 8.6 - 10.4 mg/dL Final         Passed - Vitamin D in normal range and within 360 days    Vit D, 25-Hydroxy  Date Value Ref Range Status  06/07/2022 66 30 - 100 ng/mL Final    Comment:    Vitamin D Status         25-OH Vitamin D: . Deficiency:                    <20 ng/mL Insufficiency:             20 - 29 ng/mL Optimal:                 > or = 30 ng/mL . For 25-OH Vitamin D testing on patients on  D2-supplementation and patients for whom quantitation  of D2 and D3 fractions is required, the QuestAssureD(TM) 25-OH VIT D, (D2,D3), LC/MS/MS is recommended: order  code 40981 (patients >43yrs). . See Note 1 . Note 1 . For additional information, please refer to  http://education.[facility].com/faq/FAQ199  (This link is being provided for informational/ educational purposes only.)          Passed - eGFR is 30 or above and within 360 days    GFR, Est  African American  Date Value Ref Range Status  05/25/2020 90 > OR = 60 mL/min/1.21m2 Final   GFR, Est Non African American  Date Value Ref Range Status  05/25/2020 77 > OR = 60 mL/min/1.26m2 Final   eGFR  Date Value Ref Range Status  06/07/2022 94 > OR = 60 mL/min/1.76m2 Final         Passed - Valid encounter within last 12 months    Recent Outpatient Visits           3 weeks ago Chronic knee pain after total replacement of left knee joint   [place] [facility] [place], Netta Neat, DO   3 months ago Bartholin gland cyst   [place] [facility] Smitty Cords, DO   3 months ago Chronic knee pain after total replacement of left knee joint  [facility] Ashley Royalty, Ocie Bob, MD   4 months ago Morbid obesity with BMI of 40.0-44.9, adult [facility])   [place] [facility] [place], Netta Neat, DO   8 months ago Annual physical exam   [place] [facility], Netta Neat, DO              Passed - Bone Mineral Density or Dexa Scan completed in the last 2 years

## 2023-03-06 ENCOUNTER — Telehealth: Payer: Self-pay

## 2023-03-06 DIAGNOSIS — G43909 Migraine, unspecified, not intractable, without status migrainosus: Secondary | ICD-10-CM

## 2023-03-06 MED ORDER — AMITRIPTYLINE HCL 10 MG PO TABS: 10.0000 mg | ORAL_TABLET | Freq: Every day | ORAL | 2 refills | Status: DC

## 2023-03-06 MED ORDER — RIZATRIPTAN BENZOATE 10 MG PO TBDP: 10.0000 mg | ORAL_TABLET | ORAL | 2 refills | Status: DC | PRN

## 2023-03-06 NOTE — Telephone Encounter (Signed)
Okay.  So the order last time for Nurtec was 16 pills for every other day "prevention" dosing for migraine.  The medications she is required to try and fail, are different for that dosing vs the "abortive" dosing with 8 pills per month.  Do you have the list of medicines that they prefer instead? I can remember some, but I would need to look at the list again if you still have it or if they sent a new list of preferred medicines.  Saralyn Pilar, DO [facility] 03/06/2023, 12:20 PM

## 2023-03-06 NOTE — Telephone Encounter (Signed)
Reviewed paperwork. From [place] Den[place].  For Preventative dosing - she will need to try and fail after 2 month trial ONE of the following - Candesartan - Amitriptyline - Beta blocker - Atenolol, metoprolol, etc - Topiramate - Venlafaxine  I called her. Discussed her options. We chose Amitriptyline Elavil 10mg  nightly, it may help neuropathic pain too. She has to be cautious with side effects, given already taking Duloxetine cymbalta. She will monitor it and start it now, I advised 2 months required to get authorization for Nurtec again.  I also ordered Rizatriptan ODT for AS NEEDED migraine abortive use  She can follow-up after 2-3 months and we can re attempt the Nurtec ODT authorization at that time.  Saralyn Pilar, DO [facility] 03/06/2023, 4:04 PM

## 2023-03-12 DIAGNOSIS — B351 Tinea unguium: Secondary | ICD-10-CM | POA: Diagnosis not present

## 2023-03-12 DIAGNOSIS — M79675 Pain in left toe(s): Secondary | ICD-10-CM | POA: Diagnosis not present

## 2023-03-12 DIAGNOSIS — M2042 Other hammer toe(s) (acquired), left foot: Secondary | ICD-10-CM | POA: Diagnosis not present

## 2023-03-12 DIAGNOSIS — M79674 Pain in right toe(s): Secondary | ICD-10-CM | POA: Diagnosis not present

## 2023-03-12 DIAGNOSIS — M2012 Hallux valgus (acquired), left foot: Secondary | ICD-10-CM | POA: Diagnosis not present

## 2023-04-12 ENCOUNTER — Other Ambulatory Visit: Payer: Self-pay | Admitting: Family Medicine

## 2023-04-12 DIAGNOSIS — F3342 Major depressive disorder, recurrent, in full remission: Secondary | ICD-10-CM

## 2023-04-12 DIAGNOSIS — G8929 Other chronic pain: Secondary | ICD-10-CM

## 2023-04-12 DIAGNOSIS — M1711 Unilateral primary osteoarthritis, right knee: Secondary | ICD-10-CM

## 2023-04-13 NOTE — Telephone Encounter (Signed)
 Requested Prescriptions  Pending Prescriptions Disp Refills   DULoxetine (CYMBALTA) 30 MG capsule [Pharmacy Med Name: DULoxetine HCl 30 MG Oral Capsule Delayed Release Particles] 200 capsule 1    Sig: TAKE 1 CAPSULE BY MOUTH TWICE  DAILY     Psychiatry: Antidepressants - SNRI - duloxetine Failed - 04/13/2023 12:05 PM      Failed - Cr in normal range and within 360 days    Creat  Date Value Ref Range Status  06/07/2022 0.57 (L) 0.60 - 1.00 mg/dL Final         Passed - eGFR is 30 or above and within 360 days    GFR, Est African American  Date Value Ref Range Status  05/25/2020 90 > OR = 60 mL/min/1.34m2 Final   GFR, Est Non African American  Date Value Ref Range Status  05/25/2020 77 > OR = 60 mL/min/1.73m2 Final   eGFR  Date Value Ref Range Status  06/07/2022 94 > OR = 60 mL/min/1.20m2 Final         Passed - Completed PHQ-2 or PHQ-9 in the last 360 days      Passed - Last BP in normal range    BP Readings from Last 1 Encounters:  02/05/23 124/78         Passed - Valid encounter within last 6 months    Recent Outpatient Visits           2 months ago Chronic knee pain after total replacement of left knee joint   [place] [facility] Smitty Cords, DO   4 months ago Bartholin gland cyst   [place] [facility] [place], Netta Neat, DO   4 months ago Chronic knee pain after total replacement of left knee joint   [place] [facility] Ashley Royalty, Ocie Bob, MD   6 months ago Morbid obesity with BMI of 40.0-44.9, adult [facility])    [facility] [place], Netta Neat, DO   10 months ago Annual physical exam   [facility] [place], Netta Neat, [place]

## 2023-04-15 ENCOUNTER — Other Ambulatory Visit: Payer: Self-pay | Admitting: Family Medicine

## 2023-04-15 DIAGNOSIS — J0110 Acute frontal sinusitis, unspecified: Secondary | ICD-10-CM

## 2023-04-17 NOTE — Telephone Encounter (Signed)
 Requested Prescriptions  Pending Prescriptions Disp Refills   fluticasone (FLONASE) 50 MCG/ACT nasal spray [Pharmacy Med Name: Fluticasone Propionate 50 MCG/ACT Nasal Suspension] 48 g 1    Sig: USE 2 SPRAYS IN BOTH NOSTRILS  DAILY     Ear, Nose, and Throat: Nasal Preparations - Corticosteroids Passed - 04/17/2023  9:25 AM      Passed - Valid encounter within last 12 months    Recent Outpatient Visits           2 months ago Chronic knee pain after total replacement of left knee joint   [place] [facility] Smitty Cords, DO   4 months ago Bartholin gland cyst   [place] [facility] [place], Netta Neat, DO   4 months ago Chronic knee pain after total replacement of left knee joint   [place] [facility], Ocie Bob, MD   6 months ago Morbid obesity with BMI of 40.0-44.9, adult [facility])   [place] [facility] [place], Netta Neat, DO   10 months ago Annual physical exam   [facility] [place], Netta Neat, [place]

## 2023-05-05 ENCOUNTER — Other Ambulatory Visit: Payer: Self-pay | Admitting: Family Medicine

## 2023-05-05 DIAGNOSIS — G43909 Migraine, unspecified, not intractable, without status migrainosus: Secondary | ICD-10-CM

## 2023-05-06 IMAGING — MG MM DIGITAL SCREENING BILAT W/ TOMO AND CAD
6 of 10 series · 6 of 30 positions shown · non-contrast
Comparison: Previous exam(s).

CLINICAL DATA: Screening.

EXAM:
DIGITAL SCREENING BILATERAL MAMMOGRAM WITH TOMOSYNTHESIS AND CAD
TECHNIQUE: Bilateral screening digital craniocaudal and mediolateral oblique
mammograms were obtained. Bilateral screening digital breast
tomosynthesis was performed. The images were evaluated with
computer-aided detection.

[R CC synth-2D]
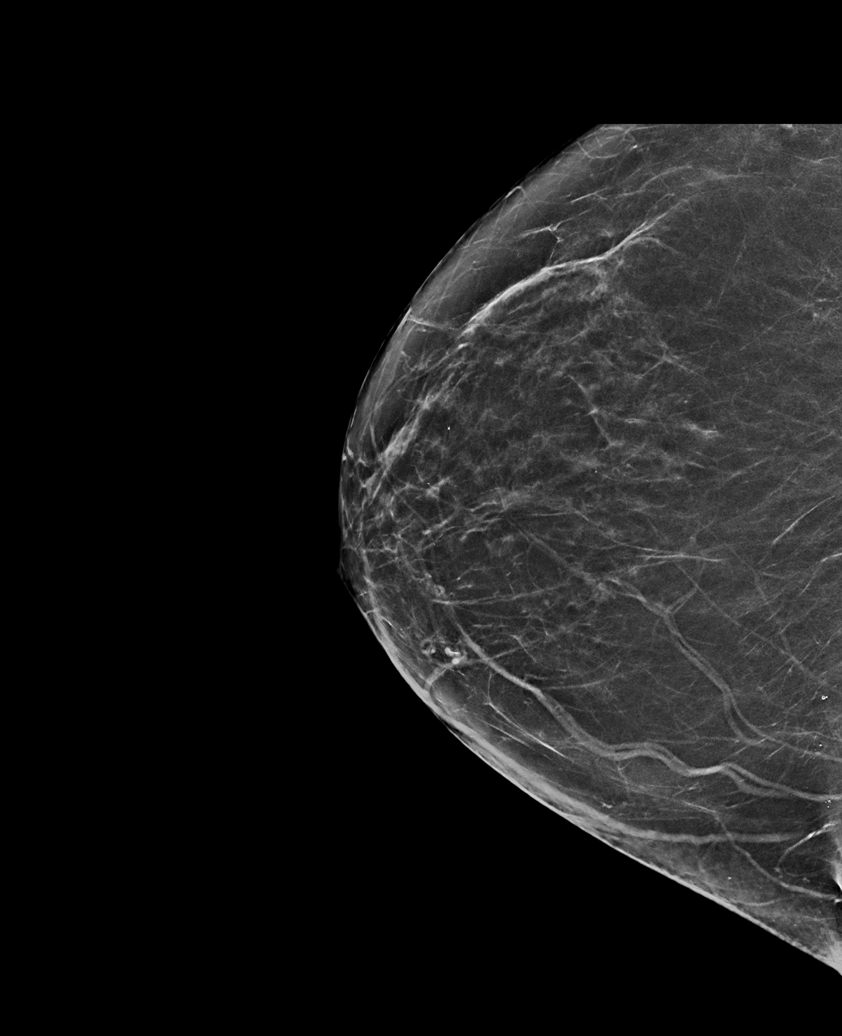

[L MLO synth-2D]
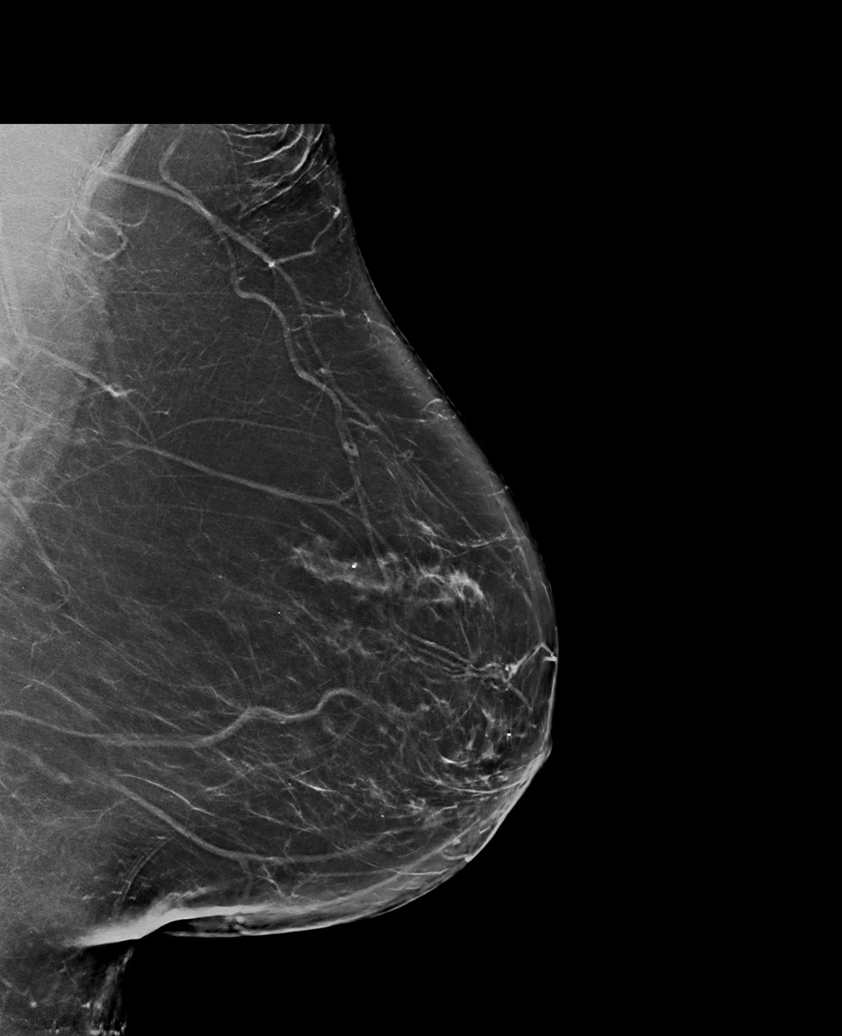

[R MLO synth-2D (1 of 2)]
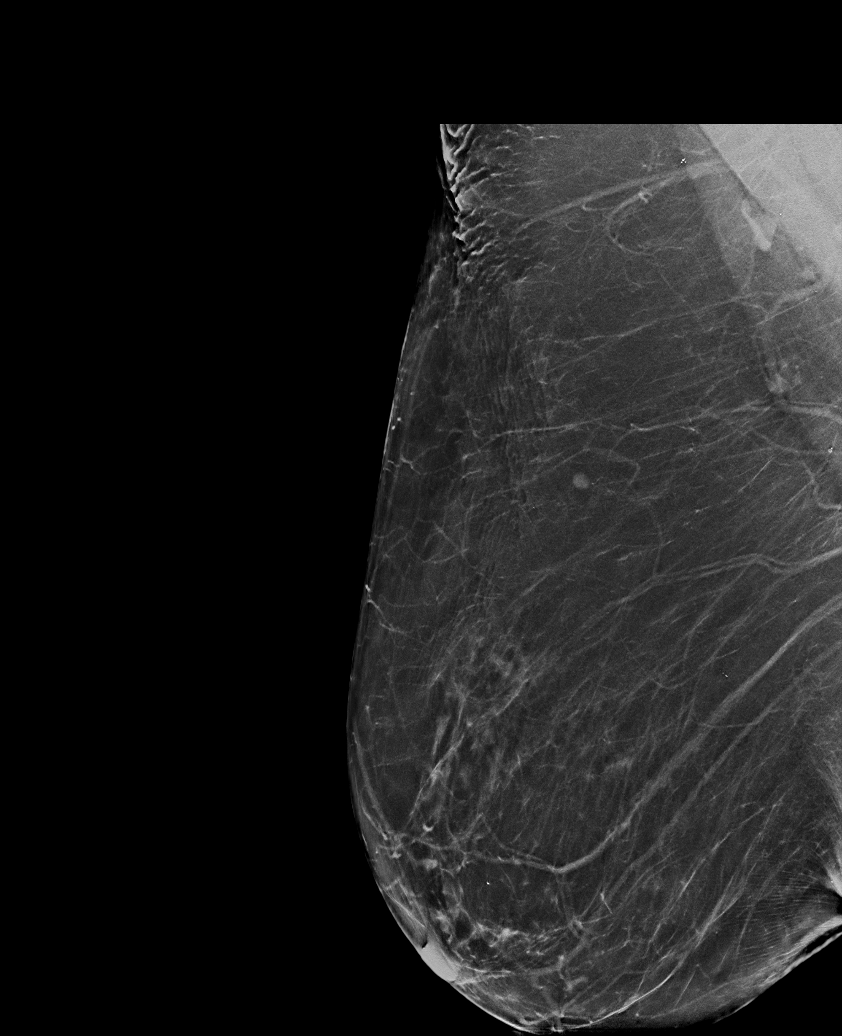

[L CC synth-2D]
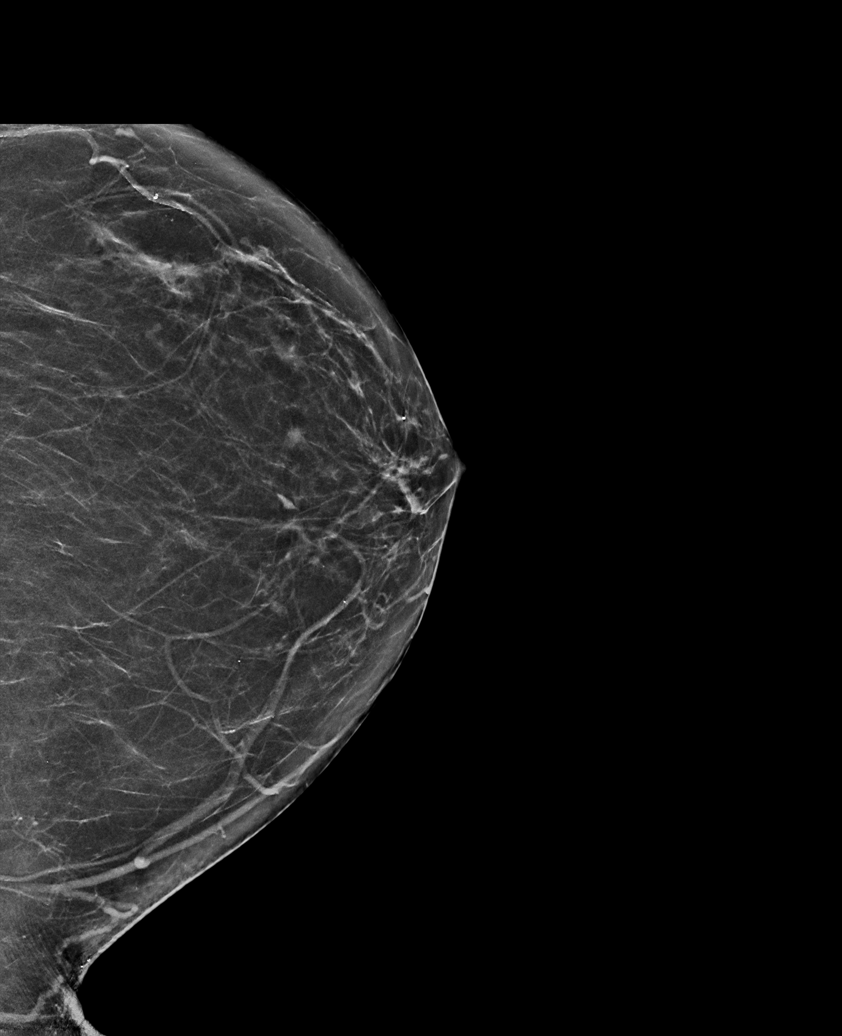

[R MLO synth-2D (2 of 2)]
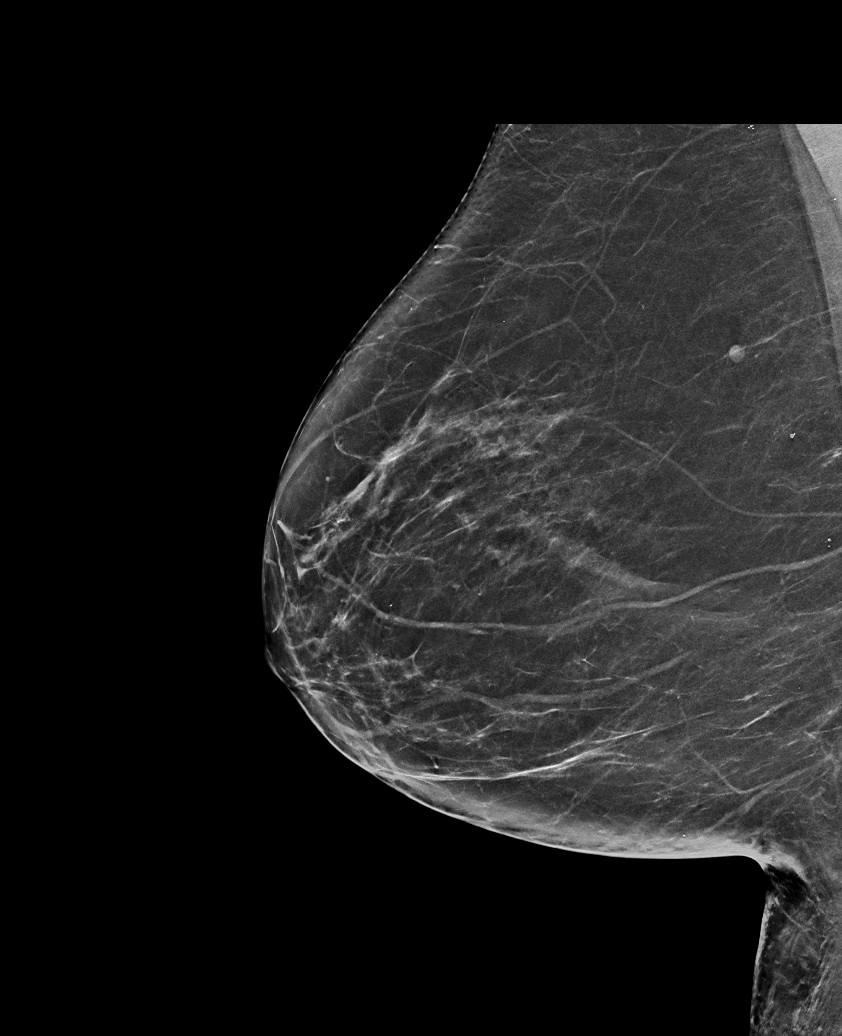

[R MLO tomo · tomo slice 43/86.0]
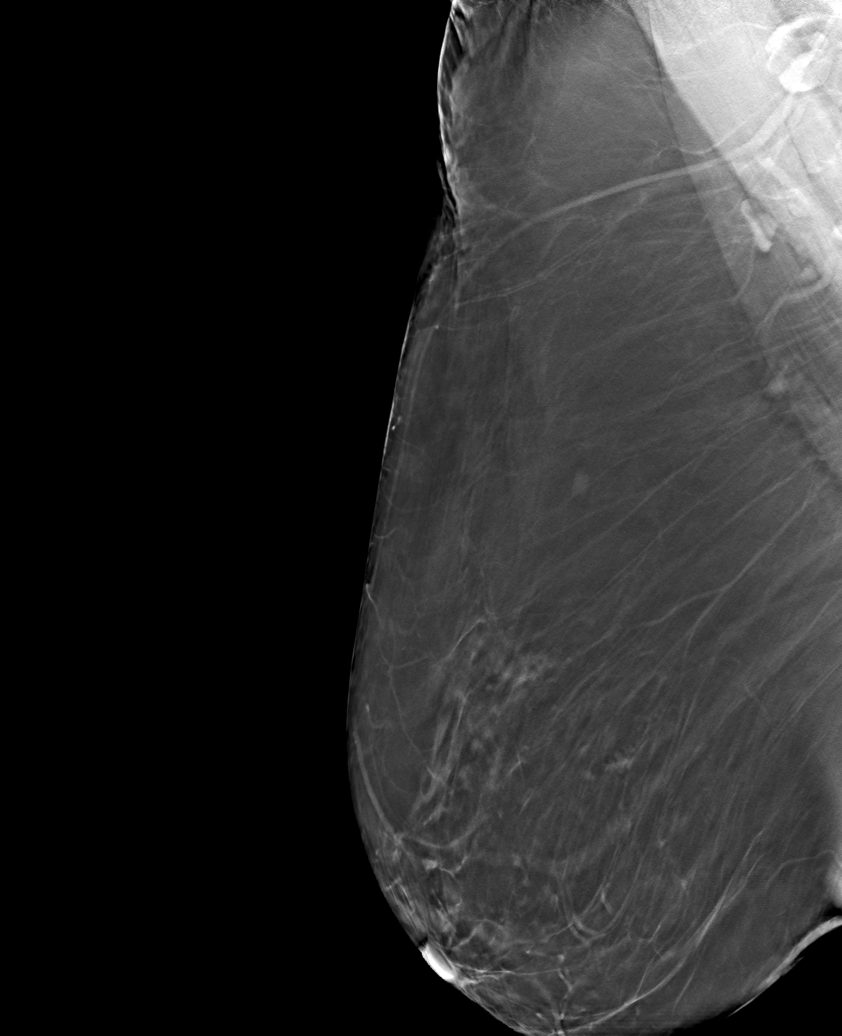

[6 of 30 positions shown; findings below may reference images not displayed]

ACR Breast Density Category b: There are scattered areas of
fibroglandular density.
FINDINGS: In the left breast, a possible asymmetry warrants further
evaluation. In the right breast, no findings suspicious for
malignancy.
IMPRESSION: Further evaluation is suggested for possible asymmetry in the left
breast.

RECOMMENDATION:
Diagnostic mammogram and possibly ultrasound of the left breast.
(Code:[id number])

The patient will be contacted regarding the findings, and additional
imaging will be scheduled.

BI-RADS CATEGORY  0: Incomplete. Need additional imaging evaluation
and/or prior mammograms for comparison.

## 2023-05-08 NOTE — Telephone Encounter (Signed)
 Requested medication (s) are due for refill today: na   Requested medication (s) are on the active medication list: yes   Last refill:  03/06/23 #30 2 refills  Future visit scheduled: no   Notes to clinic:  last OV 02/05/23. No refills remain. Do you want to refill Rx?     Requested Prescriptions  Pending Prescriptions Disp Refills   amitriptyline (ELAVIL) 10 MG tablet [Pharmacy Med Name: AMITRIPTYLINE 10MG  TABLETS] 30 tablet 2    Sig: TAKE 1 TABLET(10 MG) BY MOUTH AT BEDTIME     Psychiatry:  Antidepressants - Heterocyclics (TCAs) Failed - 05/08/2023  8:26 AM      Failed - Valid encounter within last 6 months    Recent Outpatient Visits   None            Passed - Completed PHQ-2 or PHQ-9 in the last 360 days

## 2023-05-08 NOTE — Telephone Encounter (Signed)
 Please contact patient to confirm if she is taking Amitriptyline still for migraine prevention. Or if she is doing well on Nurtec every other day and if this is preferred to manage her migraines instead. May not be required to take the Amitriptyline if doing well on Nurtec.  Saralyn Pilar, DO [facility] 05/08/2023, 11:05 AM

## 2023-05-10 DIAGNOSIS — B351 Tinea unguium: Secondary | ICD-10-CM | POA: Diagnosis not present

## 2023-05-10 DIAGNOSIS — Z79899 Other long term (current) drug therapy: Secondary | ICD-10-CM | POA: Diagnosis not present

## 2023-05-10 DIAGNOSIS — M2012 Hallux valgus (acquired), left foot: Secondary | ICD-10-CM | POA: Diagnosis not present

## 2023-05-24 ENCOUNTER — Other Ambulatory Visit: Payer: Self-pay | Admitting: Family Medicine

## 2023-05-24 DIAGNOSIS — G8929 Other chronic pain: Secondary | ICD-10-CM

## 2023-05-24 DIAGNOSIS — J3089 Other allergic rhinitis: Secondary | ICD-10-CM

## 2023-05-24 DIAGNOSIS — M1711 Unilateral primary osteoarthritis, right knee: Secondary | ICD-10-CM

## 2023-05-25 NOTE — Telephone Encounter (Signed)
 Requested Prescriptions  Pending Prescriptions Disp Refills   gabapentin  (NEURONTIN ) 100 MG capsule [Pharmacy Med Name: Gabapentin  100 MG Oral Capsule] 200 capsule 0    Sig: TAKE 1 CAPSULE BY MOUTH TWICE  DAILY     Neurology: Anticonvulsants - gabapentin  Failed - 05/25/2023 12:11 PM      Failed - Cr in normal range and within 360 days    Creat  Date Value Ref Range Status  06/07/2022 0.57 (L) 0.60 - 1.00 mg/dL Final         Failed - Valid encounter within last 12 months    Recent Outpatient Visits   None            Passed - Completed PHQ-2 or PHQ-9 in the last 360 days       montelukast  (SINGULAIR ) 10 MG tablet [Pharmacy Med Name: Montelukast  Sodium 10 MG Oral Tablet] 100 tablet 0    Sig: TAKE 1 TABLET BY MOUTH AT  BEDTIME     Pulmonology:  Leukotriene Inhibitors Failed - 05/25/2023 12:11 PM      Failed - Valid encounter within last 12 months    Recent Outpatient Visits   None

## 2023-05-31 ENCOUNTER — Telehealth: Payer: Self-pay

## 2023-05-31 NOTE — Telephone Encounter (Signed)
 I am unsure what she is referring to specifically. Her last visit was 01/2023. I assume this request is related to her Knee replacement surgery history? But I am not sure exactly.  Possibly she is requesting a new Home Health order, but I would need to know more information. All Home Health orders require office visit / notes within a certain time period - could be 30-90 days, I am not exactly sure.  I would suggest an appointment if she needs New Home Health orders. Or maybe this is a continuation of service? Regardless, we would need to know which [facility] and what her specific needs are / reason for receiving home health.  She will still likely need an apt but this info would help!  Domingo Friend, DO [facility] 05/31/2023, 3:48 PM

## 2023-05-31 NOTE — Telephone Encounter (Signed)
 LM for patient to call back for further information. Okay to get information below and/or schedule patient if she calls back.

## 2023-05-31 NOTE — Telephone Encounter (Signed)
 Patient scheduled appointment for Tuesday 06/05/23.   Pt also mentioned she was unable to get in w/ provider that was located in [place] for her Knee. Patient states that provider retired and would need help w/ getting another provider.

## 2023-05-31 NOTE — Telephone Encounter (Signed)
 Copied from CRM [id number]. Topic: Clinical - Prescription Issue >> May 31, 2023  3:18 PM Ivette P wrote: Reason for CRM: Pt called in to request a copy of the home health script so she can call and request home health order herself.    Pt callback [phone]

## 2023-06-05 ENCOUNTER — Telehealth: Payer: Self-pay

## 2023-06-05 ENCOUNTER — Ambulatory Visit (INDEPENDENT_AMBULATORY_CARE_PROVIDER_SITE_OTHER): Admitting: Family Medicine

## 2023-06-05 ENCOUNTER — Encounter: Payer: Self-pay | Admitting: Family Medicine

## 2023-06-05 VITALS — BP 124/70 | HR 80 | Ht 61.0 in | Wt 196.0 lb

## 2023-06-05 DIAGNOSIS — M170 Bilateral primary osteoarthritis of knee: Secondary | ICD-10-CM | POA: Diagnosis not present

## 2023-06-05 DIAGNOSIS — J432 Centrilobular emphysema: Secondary | ICD-10-CM

## 2023-06-05 DIAGNOSIS — G8929 Other chronic pain: Secondary | ICD-10-CM

## 2023-06-05 DIAGNOSIS — M25562 Pain in left knee: Secondary | ICD-10-CM | POA: Diagnosis not present

## 2023-06-05 DIAGNOSIS — Z96652 Presence of left artificial knee joint: Secondary | ICD-10-CM | POA: Diagnosis not present

## 2023-06-05 DIAGNOSIS — J3089 Other allergic rhinitis: Secondary | ICD-10-CM | POA: Diagnosis not present

## 2023-06-05 DIAGNOSIS — J0110 Acute frontal sinusitis, unspecified: Secondary | ICD-10-CM | POA: Diagnosis not present

## 2023-06-05 DIAGNOSIS — E782 Mixed hyperlipidemia: Secondary | ICD-10-CM | POA: Diagnosis not present

## 2023-06-05 MED ORDER — AZITHROMYCIN 250 MG PO TABS: ORAL_TABLET | ORAL | 0 refills | Status: DC

## 2023-06-05 NOTE — Telephone Encounter (Signed)
 Copied from CRM [id number]. Topic: General - Other >> Jun 05, 2023 10:27 AM Diana Mcneil wrote: Reason for CRM: Patient returned call from office, no message left.

## 2023-06-05 NOTE — Patient Instructions (Addendum)
 Thank you for coming to the office today.  Our VBCI Team will contact you at home for discussing what to do next about arranging Personal Care Services - home aide, for errands, bathing, dressing, food prep etc, non nurse activities.  We can work with them to make arrangements, orders and set this up.  We will also do new referral to the Orthopedics, locally try [facility] or other through [facility]. Since the previous one has retired.  If not heard back from anyone in next 1-2 weeks, Care Management Team or Orthopedics, contact us  back and let us  know.  For the sinus / bronchitis Start Azithromycin Z pak (antibiotic) 2 tabs day 1, then 1 tab x 4 days, complete entire course even if improved   Please schedule a Follow-up Appointment to: Return if symptoms worsen or fail to improve.  If you have any other questions or concerns, please feel free to call the office or send a message through MyChart. You may also schedule an earlier appointment if necessary.  Additionally, you may be receiving a survey about your experience at our office within a few days to 1 week by e-mail or mail. We value your feedback.  Domingo Friend, DO [facility], [place]

## 2023-06-05 NOTE — Progress Notes (Signed)
 Subjective:    Patient ID: Diana Mcneil, female    DOB: [birth year], 78 y.o.   MRN: [id number]  Diana Mcneil is a 78 y.o. female presenting on 06/05/2023 for Knee Pain   HPI  Discussed the use of AI scribe software for clinical note transcription with the patient, who gave verbal consent to proceed.  History of Present Illness   Diana Mcneil is a 78 year old female who presents for assistance with home care and finding a new orthopedic specialist.  Chronic Knee Pain / Osteoarthritis S/p Left Knee TKR (4 yr ago), still has chronic pain  She is interested in Personal Care Services in the home, given difficulty with her own health and mobility and decline with her husband's health. She has helped him as caregiver. Now asking for assistance. He has had fall that she is limited in ability to assist and she is high risk for falls as well.  She is experiencing difficulties with daily activities such as bathing, dressing, and wearing support hose, primarily due to issues with her left knee, which has previously undergone replacement surgery. She also requires help with errands like grocery shopping. Her previous orthopedic specialist has retired, and she is in need of a new referral. Attempts to find a new specialist have been complicated by the requirement to return to the original place of surgery, which is no longer an option.  She has been experiencing respiratory symptoms, including a cough producing thicker sputum and a headache, which she attributes to allergies and recent carpet cleaning. These symptoms have been present for about a week. She has a medication waiting for her, likely for allergies or sinus issues.      06/05/2023    3:26 PM 02/05/2023    3:49 PM 10/03/2022    4:15 PM  Depression screen PHQ 2/9  Decreased Interest 1 0 1  Down, Depressed, Hopeless 0 1 0  PHQ - 2 Score 1 1 1   Altered sleeping 1 1 2   Tired, decreased energy 1 1 3   Change in appetite 1 0 0   Feeling bad or failure about yourself  0 0 0  Trouble concentrating 0 0 0  Moving slowly or fidgety/restless 0 1 3  Suicidal thoughts 0 0 0  PHQ-9 Score 4 4 9   Difficult doing work/chores Not difficult at all  Somewhat difficult       06/05/2023    3:26 PM 02/05/2023    3:49 PM 10/03/2022    4:15 PM 01/16/2022    4:35 PM  GAD 7 : Generalized Anxiety Score  Nervous, Anxious, on Edge 0 1 0 0  Control/stop worrying 1 2 3 3   Worry too much - different things 1 2 2 3   Trouble relaxing 1 2 1 1   Restless 0 0 0 0  Easily annoyed or irritable 0 1 1 1   Afraid - awful might happen 1 1 1  0  Total GAD 7 Score 4 9 8 8   Anxiety Difficulty Not difficult at all  Not difficult at all Somewhat difficult    Social History   Tobacco Use   Smoking status: Former    Current packs/day: 0.50    Average packs/day: 0.5 packs/day for 40.0 years (20.0 ttl pk-yrs)    Types: Cigarettes   Smokeless tobacco: Former  Building services engineer status: Never Used  Substance Use Topics   Alcohol use: Never   Drug use: Never    Review of Systems Per HPI unless specifically  indicated above     Objective:    BP 124/70 (BP Location: Left Arm, Patient Position: Sitting, Cuff Size: Large)   Pulse 80   Ht 5\' 1"  (1.549 m)   Wt 196 lb (88.9 kg)   SpO2 95%   BMI 37.03 kg/m   Wt Readings from Last 3 Encounters:  06/05/23 196 lb (88.9 kg)  02/05/23 207 lb (93.9 kg)  12/18/22 206 lb (93.4 kg)    Physical Exam Vitals and nursing note reviewed.  Constitutional:      General: She is not in acute distress.    Appearance: Normal appearance. She is well-developed. She is not diaphoretic.     Comments: Well-appearing, comfortable, cooperative  HENT:     Head: Normocephalic and atraumatic.     Mouth/Throat:     Mouth: Mucous membranes are moist.     Pharynx: No oropharyngeal exudate.     Comments: Hoarse voice Eyes:     General:        Right eye: No discharge.        Left eye: No discharge.      Conjunctiva/sclera: Conjunctivae normal.  Cardiovascular:     Rate and Rhythm: Normal rate.  Pulmonary:     Effort: Pulmonary effort is normal. No respiratory distress.     Breath sounds: Rhonchi present. No wheezing or rales.     Comments: cough Musculoskeletal:     Comments: Left Knee bulky s/p TKR, tender to palpation, reduced ROM  Skin:    General: Skin is warm and dry.     Findings: No erythema or rash.  Neurological:     Mental Status: She is alert and oriented to person, place, and time.  Psychiatric:        Mood and Affect: Mood normal.        Behavior: Behavior normal.        Thought Content: Thought content normal.     Comments: Well groomed, good eye contact, normal speech and thoughts     Results for orders placed or performed in visit on 06/07/22  Vitamin B12   Collection Time: 06/07/22 10:51 AM  Result Value Ref Range   Vitamin B-12 1,306 (H) 200 - 1,100 pg/mL  VITAMIN D  25 Hydroxy (Vit-D Deficiency, Fractures)   Collection Time: 06/07/22 10:51 AM  Result Value Ref Range   Vit D, 25-Hydroxy 66 30 - 100 ng/mL  TSH   Collection Time: 06/07/22 10:51 AM  Result Value Ref Range   TSH 2.08 0.40 - 4.50 mIU/L  Hemoglobin A1c   Collection Time: 06/07/22 10:51 AM  Result Value Ref Range   Hgb A1c MFr Bld 5.2 <5.7 % of total Hgb   Mean Plasma Glucose 103 mg/dL   eAG (mmol/L) 5.7 mmol/L  Lipid panel   Collection Time: 06/07/22 10:51 AM  Result Value Ref Range   Cholesterol 144 <200 mg/dL   HDL 59 > OR = 50 mg/dL   Triglycerides 93 <130 mg/dL   LDL Cholesterol (Calc) 67 mg/dL (calc)   Total CHOL/HDL Ratio 2.4 <5.0 (calc)   Non-HDL Cholesterol (Calc) 85 <865 mg/dL (calc)  CBC with Differential/Platelet   Collection Time: 06/07/22 10:51 AM  Result Value Ref Range   WBC 5.2 3.8 - 10.8 Thousand/uL   RBC 4.54 3.80 - 5.10 Million/uL   Hemoglobin 14.0 11.7 - 15.5 g/dL   HCT 78.4 69.6 - 29.5 %   MCV 93.4 80.0 - 100.0 fL   MCH 30.8 27.0 - 33.0 pg  MCHC 33.0 32.0 -  36.0 g/dL   RDW 16.1 09.6 - 04.5 %   Platelets 179 140 - 400 Thousand/uL   MPV 11.6 7.5 - 12.5 fL   Neutro Abs 3,177 1,500 - 7,800 cells/uL   Lymphs Abs 1,487 850 - 3,900 cells/uL   Absolute Monocytes 416 200 - 950 cells/uL   Eosinophils Absolute 88 15 - 500 cells/uL   Basophils Absolute 31 0 - 200 cells/uL   Neutrophils Relative % 61.1 %   Total Lymphocyte 28.6 %   Monocytes Relative 8.0 %   Eosinophils Relative 1.7 %   Basophils Relative 0.6 %  COMPLETE METABOLIC PANEL WITH GFR   Collection Time: 06/07/22 10:51 AM  Result Value Ref Range   Glucose, Bld 92 65 - 139 mg/dL   BUN 8 7 - 25 mg/dL   Creat 4.09 (L) 8.11 - 1.00 mg/dL   eGFR 94 > OR = 60 BJ/YNW/2.95A2   BUN/Creatinine Ratio 14 6 - 22 (calc)   Sodium 140 135 - 146 mmol/L   Potassium 4.1 3.5 - 5.3 mmol/L   Chloride 104 98 - 110 mmol/L   CO2 23 20 - 32 mmol/L   Calcium  9.0 8.6 - 10.4 mg/dL   Total Protein 6.3 6.1 - 8.1 g/dL   Albumin 3.9 3.6 - 5.1 g/dL   Globulin 2.4 1.9 - 3.7 g/dL (calc)   AG Ratio 1.6 1.0 - 2.5 (calc)   Total Bilirubin 0.8 0.2 - 1.2 mg/dL   Alkaline phosphatase (APISO) 80 37 - 153 U/L   AST 16 10 - 35 U/L   ALT 7 6 - 29 U/L      Assessment & Plan:   Problem List Items Addressed This Visit     Chronic knee pain after total replacement of left knee joint - Primary   Relevant Orders   AMB Referral VBCI Care Management   Ambulatory referral to Orthopedic Surgery   COPD (chronic obstructive pulmonary disease) (HCC)   Relevant Medications   azithromycin (ZITHROMAX Z-PAK) 250 MG tablet   Other Relevant Orders   AMB Referral VBCI Care Management   Hyperlipidemia   Relevant Orders   AMB Referral VBCI Care Management   Other Visit Diagnoses       Acute non-recurrent frontal sinusitis       Relevant Medications   azithromycin (ZITHROMAX Z-PAK) 250 MG tablet     S/P TKR (total knee replacement), left       Relevant Orders   AMB Referral VBCI Care Management   Ambulatory referral to Orthopedic  Surgery     Environmental and seasonal allergies         Primary osteoarthritis of both knees       Relevant Orders   Ambulatory referral to Orthopedic Surgery        Cough with mucus production Cough with thicker mucus likely due to allergies from carpet cleaning and pollen. Coarse lung sounds suggest mucus accumulation. Possible bacterial component. Lack of improvement at this point and some persistent URI congestion - Prescribed azithromycin (Z-Pak). - Advised allergy and sinus medications, including nasal sprays and decongestants.  Left knee pain post-replacement OA Both knees Chronic knee pain post-replacement. Requires assistance with daily activities.   Has seen Sports Med, no further interventions at this time. Needs Ortho again for the L Knee that was s/p TKR years ago.  referral to Orthopedics for further evaluation and management of chronic bilateral knee pain, osteoarthritis. She is s/p L TKR (4 yr ago) and  still has Left Knee Pain and popping. She has R sided symptoms as well, with moderate to severe arthritis on R. She was evaluated by Sports medicine, however determined should return to Orthopedic for Left Knee chronic pain despite TKR. The operating surgeon has retired out of [facility] [place]. They do not have another provider. She prefers to establish locally if possible now she lives in [place]      Patient has multiple chronic health conditions impacting her health and mobility, primarily with advanced osteoarthritis multiple joints including knees, with Left Knee s/p total replacement and has complications. Her husband has multiple health issues as well, and she is having difficulty maintaining at home with ADLs dressing, bathing, food prep, errands, groceries. She is requesting to establish with Personal Care Services. She has medicare/medicaid dual complete. I advised her that I would connect her with VBCI team to explore what services she is eligible  for that can help her in the home. I explained that this is not home health but instead it is more home care / aide, as the things she is requesting are not skilled nursing related. She has a contact out of [place] for a company that has offered to help but they need orders. I advised her that typically we link up with our VBCI first and once the need is determined I can provide medical info / sign orders to initiate services. Thank you!   Orders Placed This Encounter  Procedures   AMB Referral VBCI Care Management    Referral Priority:   Routine    Referral Type:   Consultation    Referral Reason:   Care Coordination    Number of Visits Requested:   1   Ambulatory referral to Orthopedic Surgery    Referral Priority:   Routine    Referral Type:   Surgical    Referral Reason:   Specialty Services Required    Requested Specialty:   Orthopedic Surgery    Number of Visits Requested:   1    Meds ordered this encounter  Medications   azithromycin (ZITHROMAX Z-PAK) 250 MG tablet    Sig: Take 2 tabs (500mg  total) on Day 1. Take 1 tab (250mg ) daily for next 4 days.    Dispense:  6 tablet    Refill:  0    Follow up plan: Return if symptoms worsen or fail to improve.  Domingo Friend, DO [facility] 06/05/2023, 3:44 PM

## 2023-06-11 ENCOUNTER — Telehealth: Payer: Self-pay

## 2023-06-11 NOTE — Progress Notes (Signed)
 Complex Care Management Note  Care Guide Note 06/11/2023 Name: Diana Mcneil MRN: [id number] DOB: Jul 28, 1945  Diana Mcneil is a 78 y.o. year old female who sees Raina Bunting, DO for primary care. I reached out to Arna Lanes by phone today to offer complex care management services.  [name] was given information about Complex Care Management services today including:   The Complex Care Management services include support from the care team which includes your Nurse Care Manager, Clinical Social Worker, or Pharmacist.  The Complex Care Management team is here to help remove barriers to the health concerns and goals most important to you. Complex Care Management services are voluntary, and the patient may decline or stop services at any time by request to their care team member.   Complex Care Management Consent Status: Patient agreed to services and verbal consent obtained.   Follow up plan:  Telephone appointment with complex care management team member scheduled for:  LCSW 07/02/2023 and RNCM 07/10/2023  Encounter Outcome:  Patient Scheduled  [name] , RMA     [place]  [facility], [place] Guide  Direct Dial: [phone]  Website: [email or web address]

## 2023-06-14 ENCOUNTER — Other Ambulatory Visit: Payer: Self-pay | Admitting: Family Medicine

## 2023-06-14 DIAGNOSIS — K219 Gastro-esophageal reflux disease without esophagitis: Secondary | ICD-10-CM

## 2023-06-18 DIAGNOSIS — M1711 Unilateral primary osteoarthritis, right knee: Secondary | ICD-10-CM | POA: Diagnosis not present

## 2023-06-18 NOTE — Telephone Encounter (Signed)
 Requested Prescriptions  Pending Prescriptions Disp Refills   pantoprazole  (PROTONIX ) 40 MG tablet [Pharmacy Med Name: Pantoprazole  Sodium 40 MG Oral Tablet Delayed Release] 100 tablet 1    Sig: TAKE 1 TABLET BY MOUTH DAILY  BEFORE BREAKFAST     Gastroenterology: Proton Pump Inhibitors Failed - 06/18/2023 11:30 AM      Failed - Valid encounter within last 12 months    Recent Outpatient Visits           1 week ago Chronic knee pain after total replacement of left knee joint   [facility], Kayleen [place], [place]

## 2023-06-26 ENCOUNTER — Encounter (INDEPENDENT_AMBULATORY_CARE_PROVIDER_SITE_OTHER): Payer: Self-pay

## 2023-07-02 ENCOUNTER — Telehealth: Admitting: *Deleted

## 2023-07-10 ENCOUNTER — Telehealth: Payer: Self-pay

## 2023-07-11 ENCOUNTER — Encounter: Payer: Self-pay | Admitting: *Deleted

## 2023-07-11 ENCOUNTER — Telehealth: Payer: Self-pay | Admitting: *Deleted

## 2023-07-11 DIAGNOSIS — M1612 Unilateral primary osteoarthritis, left hip: Secondary | ICD-10-CM | POA: Diagnosis not present

## 2023-07-11 DIAGNOSIS — M170 Bilateral primary osteoarthritis of knee: Secondary | ICD-10-CM | POA: Diagnosis not present

## 2023-07-11 NOTE — Patient Outreach (Signed)
 Return phone call from patient to re-schedule initial appointment. Appointment re-scheduled for Monday 07/16/23 at 4pm. RNCM re-scheduled for 07/24/22 at 3:45pm.   Diana Adolphus, LCSW [place]  [facility], [facility] Licensed Clinical Social Worker Care Coordinator  Direct Dial: [phone]

## 2023-07-16 ENCOUNTER — Encounter: Payer: Self-pay | Admitting: *Deleted

## 2023-07-16 ENCOUNTER — Telehealth: Payer: Self-pay | Admitting: *Deleted

## 2023-07-24 ENCOUNTER — Telehealth: Payer: Self-pay

## 2023-07-25 ENCOUNTER — Telehealth: Payer: Self-pay

## 2023-07-25 DIAGNOSIS — R7309 Other abnormal glucose: Secondary | ICD-10-CM

## 2023-07-25 DIAGNOSIS — M150 Primary generalized (osteo)arthritis: Secondary | ICD-10-CM

## 2023-07-25 DIAGNOSIS — E782 Mixed hyperlipidemia: Secondary | ICD-10-CM

## 2023-07-25 NOTE — Telephone Encounter (Signed)
 Copied from CRM [id number]. Topic: Clinical - Request for Lab/Test Order >> Jul 24, 2023  4:47 PM Kevelyn M wrote: Reason for CRM: Patient's Ortho surgeon needed lab done before surgery on 08/30/2022. Dr. Hobart Lulas is her doctor. Patient is not sure of which labs. Please call patient back when lab order is ready. Call back # [phone]

## 2023-07-25 NOTE — Telephone Encounter (Signed)
 Labs ordered CMET CBC A1c, printed. She can schedule lab whenever. We can fax to Ortho  Domingo Friend, DO [facility] 07/25/2023, 12:44 PM

## 2023-07-25 NOTE — Addendum Note (Signed)
 Addended by: Raina Bunting on: 07/25/2023 12:44 PM   Modules accepted: Orders

## 2023-07-25 NOTE — Telephone Encounter (Signed)
 We can do labs if need. But need to know which labs and when etc

## 2023-07-25 NOTE — Telephone Encounter (Signed)
 Spoke to patient, she will call Dr. Hobart Lulas office and clarify which labs he is needing drawn.

## 2023-07-25 NOTE — Telephone Encounter (Signed)
 Copied from CRM [id number]. Topic: Clinical - Request for Lab/Test Order >> Jul 25, 2023  9:29 AM Hassie Lint wrote: Reason for CRM: Patient returning call in regards to labs needed for upcoming Hip surgery on 07/24. Surgeon (Dr Adora Aland) needs: CBC with differential platelets COMP metabolic ([id number]) Hemoglobin [id number]   Patient would like a callback when orders placed to schedule. Patient can be reached at [phone]

## 2023-08-01 ENCOUNTER — Other Ambulatory Visit

## 2023-08-02 ENCOUNTER — Other Ambulatory Visit: Payer: Self-pay | Admitting: Family Medicine

## 2023-08-02 ENCOUNTER — Other Ambulatory Visit

## 2023-08-02 DIAGNOSIS — Z01818 Encounter for other preprocedural examination: Secondary | ICD-10-CM

## 2023-08-02 DIAGNOSIS — M150 Primary generalized (osteo)arthritis: Secondary | ICD-10-CM | POA: Diagnosis not present

## 2023-08-02 DIAGNOSIS — R7309 Other abnormal glucose: Secondary | ICD-10-CM

## 2023-08-02 DIAGNOSIS — J432 Centrilobular emphysema: Secondary | ICD-10-CM

## 2023-08-02 DIAGNOSIS — G8929 Other chronic pain: Secondary | ICD-10-CM

## 2023-08-02 DIAGNOSIS — M1711 Unilateral primary osteoarthritis, right knee: Secondary | ICD-10-CM

## 2023-08-02 DIAGNOSIS — Z6841 Body Mass Index (BMI) 40.0 and over, adult: Secondary | ICD-10-CM

## 2023-08-02 DIAGNOSIS — J3089 Other allergic rhinitis: Secondary | ICD-10-CM

## 2023-08-02 LAB — COMPREHENSIVE METABOLIC PANEL WITH GFR
AG Ratio: 2 (calc) (ref 1.0–2.5)
ALT: 7 U/L (ref 6–29)
AST: 17 U/L (ref 10–35)
Albumin: 3.8 g/dL (ref 3.6–5.1)
Alkaline phosphatase (APISO): 60 U/L (ref 37–153)
BUN: 15 mg/dL (ref 7–25)
CO2: 26 mmol/L (ref 20–32)
Calcium: 8.7 mg/dL (ref 8.6–10.4)
Chloride: 106 mmol/L (ref 98–110)
Creat: 0.62 mg/dL (ref 0.60–1.00)
Globulin: 1.9 g/dL (ref 1.9–3.7)
Glucose, Bld: 84 mg/dL (ref 65–99)
Potassium: 4.2 mmol/L (ref 3.5–5.3)
Sodium: 140 mmol/L (ref 135–146)
Total Bilirubin: 0.5 mg/dL (ref 0.2–1.2)
Total Protein: 5.7 g/dL — ABNORMAL LOW (ref 6.1–8.1)
eGFR: 92 mL/min/{1.73_m2} (ref >=60)

## 2023-08-02 LAB — CBC WITH DIFFERENTIAL/PLATELET
Absolute Lymphocytes: 1462 {cells}/uL (ref 850–3900)
Absolute Monocytes: 421 {cells}/uL (ref 200–950)
Basophils Absolute: 22 {cells}/uL (ref 0–200)
Basophils Relative: 0.5 %
Eosinophils Absolute: 82 {cells}/uL (ref 15–500)
Eosinophils Relative: 1.9 %
HCT: 40.4 % (ref 35.0–45.0)
Hemoglobin: 13.1 g/dL (ref 11.7–15.5)
MCH: 31.6 pg (ref 27.0–33.0)
MCHC: 32.4 g/dL (ref 32.0–36.0)
MCV: 97.6 fL (ref 80.0–100.0)
MPV: 11.6 fL (ref 7.5–12.5)
Monocytes Relative: 9.8 %
Neutro Abs: 2313 {cells}/uL (ref 1500–7800)
Neutrophils Relative %: 53.8 %
Platelets: 151 10*3/uL (ref 140–400)
RBC: 4.14 10*6/uL (ref 3.80–5.10)
RDW: 12.9 % (ref 11.0–15.0)
Total Lymphocyte: 34 %
WBC: 4.3 10*3/uL (ref 3.8–10.8)

## 2023-08-02 LAB — HEMOGLOBIN A1C
Hgb A1c MFr Bld: 5 % (ref <5.7)
Mean Plasma Glucose: 97 mg/dL
eAG (mmol/L): 5.4 mmol/L

## 2023-08-03 NOTE — Telephone Encounter (Signed)
 Requested Prescriptions  Pending Prescriptions Disp Refills   montelukast  (SINGULAIR ) 10 MG tablet [Pharmacy Med Name: Montelukast  Sodium 10 MG Oral Tablet] 100 tablet 1    Sig: TAKE 1 TABLET BY MOUTH AT  BEDTIME     Pulmonology:  Leukotriene Inhibitors Failed - 08/03/2023  3:14 PM      Failed - Valid encounter within last 12 months    Recent Outpatient Visits           1 month ago Chronic knee pain after total replacement of left knee joint   [facility], Marsa PARAS, DO               gabapentin  (NEURONTIN ) 100 MG capsule [Pharmacy Med Name: Gabapentin  100 MG Oral Capsule] 200 capsule 1    Sig: TAKE 1 CAPSULE BY MOUTH TWICE  DAILY     Neurology: Anticonvulsants - gabapentin  Failed - 08/03/2023  3:14 PM      Failed - Valid encounter within last 12 months    Recent Outpatient Visits           1 month ago Chronic knee pain after total replacement of left knee joint   [facility], Marsa PARAS, DO              Passed - Cr in normal range and within 360 days    Creat  Date Value Ref Range Status  08/02/2023 0.62 0.60 - 1.00 mg/dL Final         Passed - Completed PHQ-2 or PHQ-9 in the last 360 days

## 2023-08-09 ENCOUNTER — Ambulatory Visit: Payer: Self-pay | Admitting: Family Medicine

## 2023-08-22 ENCOUNTER — Other Ambulatory Visit: Payer: Self-pay

## 2023-08-22 NOTE — Patient Outreach (Unsigned)
 Complex Care Management   Visit Note  08/22/2023  Name:  Diana Mcneil MRN: [id number] DOB: [birth year]  Situation: Referral received for Complex Care Management related to {Criteria:32550} I obtained verbal consent from {CH[facility] AMB Patient/Caregiver:28184}.  Visit completed with ***  {VISIT LOCATION:32553}  Background:   Past Medical History:  Diagnosis Date   Allergy    Anxiety    Headache    Hyperlipidemia    Migraine    Sleep apnea     Assessment: Patient Reported Symptoms:  Cognitive        Neurological      HEENT        Cardiovascular      Respiratory      Endocrine      Gastrointestinal        Genitourinary      Integumentary      Musculoskeletal          Psychosocial              06/05/2023    3:26 PM  Depression screen PHQ 2/9  Decreased Interest 1  Down, Depressed, Hopeless 0  PHQ - 2 Score 1  Altered sleeping 1  Tired, decreased energy 1  Change in appetite 1  Feeling bad or failure about yourself  0  Trouble concentrating 0  Moving slowly or fidgety/restless 0  Suicidal thoughts 0  PHQ-9 Score 4  Difficult doing work/chores Not difficult at all    There were no vitals filed for this visit.  Medications Reviewed Today     Reviewed by Karoline Lima, RN (Registered Nurse) on 08/22/23 at 1407  Med List Status: <None>   Medication Order Taking? Sig Documenting Provider Last Dose Status Informant  alendronate  (FOSAMAX ) 70 MG tablet [id number]  TAKE 1 TABLET BY MOUTH WEEKLY  WITH 8 OZ OF PLAIN WATER 30  MINUTES BEFORE FIRST FOOD, DRINK OR MEDS. STAY UPRIGHT FOR 30  MINS Edman Marsa PARAS, DO  Active   amitriptyline  (ELAVIL ) 10 MG tablet [id number]  TAKE 1 TABLET(10 MG) BY MOUTH AT BEDTIME Edman Marsa PARAS, DO  Active   ascorbic acid (VITAMIN C) 500 MG tablet [id number]  Take by mouth. [provider]  Active   aspirin 81 MG EC tablet [id number]  Take by mouth. [provider]  Active   azelastine   (ASTELIN ) 0.1 % nasal spray [id number]  Place 2 sprays into both nostrils 2 (two) times daily. Use in each nostril as directed Edman Marsa PARAS, DO  Active   azithromycin  (ZITHROMAX  Z-PAK) 250 MG tablet [id number]  Take 2 tabs (500mg  total) on Day 1. Take 1 tab (250mg ) daily for next 4 days. Edman Marsa PARAS, DO  Active   Cholecalciferol 25 MCG (1000 UT) tablet [id number]  Take by mouth. [provider]  Active   DULoxetine  (CYMBALTA ) 30 MG capsule [id number]  TAKE 1 CAPSULE BY MOUTH TWICE  DAILY Karamalegos, Marsa PARAS, DO  Active   fluconazole  (DIFLUCAN ) 150 MG tablet [id number]  Take one tablet by mouth on Day 1. Repeat dose 2nd tablet on Day 3.  Patient not taking: Reported on 06/05/2023   Edman Marsa PARAS, DO  Active   fluticasone  (FLONASE ) 50 MCG/ACT nasal spray [id number]  USE 2 SPRAYS IN BOTH NOSTRILS  DAILY Edman Marsa PARAS, DO  Active   gabapentin  (NEURONTIN ) 100 MG capsule [id number]  TAKE 1 CAPSULE BY MOUTH TWICE  DAILY Edman Marsa PARAS, DO  Active   meloxicam  (MOBIC ) 7.5 MG tablet [id number]  Take 1 tablet (  7.5 mg total) by mouth daily as needed for pain. Alvia Selinda PARAS, MD  Active   montelukast  (SINGULAIR ) 10 MG tablet [id number]  TAKE 1 TABLET BY MOUTH AT  BEDTIME Edman Marsa PARAS, DO  Active   nystatin  (MYCOSTATIN /NYSTOP ) powder [id number]  Apply 1 Application topically 3 (three) times daily. For 2-4 weeks. May repeat if needed. Edman Marsa PARAS, DO  Active            Med Note HUMBERTO, JHONNIE RAMAN   Fri Aug 25, 2022  2:35 PM) Takes PRN   pantoprazole  (PROTONIX ) 40 MG tablet [id number]  TAKE 1 TABLET BY MOUTH DAILY  BEFORE BREAKFAST Karamalegos, Marsa PARAS, DO  Active   rizatriptan  (MAXALT -MLT) 10 MG disintegrating tablet [id number]  Take 1 tablet (10 mg total) by mouth as needed for migraine. May repeat in 2 hours if needed Edman Marsa PARAS, DO  Active   rosuvastatin  (CRESTOR ) 20 MG tablet [id number]  TAKE 1 TABLET BY MOUTH  AT  BEDTIME Edman Marsa PARAS, DO  Active   triamcinolone  (KENALOG ) 0.025 % ointment [id number]  Apply 1 Application topically 2 (two) times daily. Leigh Sober, MD  Active   triamcinolone  ointment (KENALOG ) 0.5 % [id number]  Apply 1 Application topically 2 (two) times daily. For 1-2 weeks affected area only then stop. Edman Marsa PARAS, DO  Active             Recommendation:   {RECOMMENDATONS:32554}  Follow Up Plan:   {FOLLOWUP:32559}  SIG ***

## 2023-08-24 NOTE — Patient Instructions (Addendum)
 Thank you for allowing the Complex Care Management team to participate in your care. It was great speaking with you!  We will follow up on September 03, 2023 at 4:30 Please do not hesitate to contact me if you require assistance prior to our next outreach.    Jackson Acron [facility] Population Health RN Care Manager Direct Dial: [phone]  Fax: [phone] Website: [email or web address]

## 2023-08-28 ENCOUNTER — Other Ambulatory Visit: Payer: Self-pay

## 2023-08-28 DIAGNOSIS — Z01818 Encounter for other preprocedural examination: Secondary | ICD-10-CM | POA: Diagnosis not present

## 2023-08-30 DIAGNOSIS — M1612 Unilateral primary osteoarthritis, left hip: Secondary | ICD-10-CM | POA: Diagnosis not present

## 2023-08-30 DIAGNOSIS — G43909 Migraine, unspecified, not intractable, without status migrainosus: Secondary | ICD-10-CM | POA: Diagnosis not present

## 2023-08-30 DIAGNOSIS — Z96652 Presence of left artificial knee joint: Secondary | ICD-10-CM | POA: Diagnosis not present

## 2023-08-30 DIAGNOSIS — Z7982 Long term (current) use of aspirin: Secondary | ICD-10-CM | POA: Diagnosis not present

## 2023-08-30 DIAGNOSIS — M25752 Osteophyte, left hip: Secondary | ICD-10-CM | POA: Diagnosis not present

## 2023-08-30 DIAGNOSIS — M810 Age-related osteoporosis without current pathological fracture: Secondary | ICD-10-CM | POA: Diagnosis not present

## 2023-08-30 DIAGNOSIS — E7800 Pure hypercholesterolemia, unspecified: Secondary | ICD-10-CM | POA: Diagnosis not present

## 2023-08-30 DIAGNOSIS — Z87891 Personal history of nicotine dependence: Secondary | ICD-10-CM | POA: Diagnosis not present

## 2023-08-31 DIAGNOSIS — E7800 Pure hypercholesterolemia, unspecified: Secondary | ICD-10-CM | POA: Diagnosis not present

## 2023-08-31 DIAGNOSIS — Z7982 Long term (current) use of aspirin: Secondary | ICD-10-CM | POA: Diagnosis not present

## 2023-08-31 DIAGNOSIS — M810 Age-related osteoporosis without current pathological fracture: Secondary | ICD-10-CM | POA: Diagnosis not present

## 2023-08-31 DIAGNOSIS — M25752 Osteophyte, left hip: Secondary | ICD-10-CM | POA: Diagnosis not present

## 2023-08-31 DIAGNOSIS — M1612 Unilateral primary osteoarthritis, left hip: Secondary | ICD-10-CM | POA: Diagnosis not present

## 2023-08-31 DIAGNOSIS — G43909 Migraine, unspecified, not intractable, without status migrainosus: Secondary | ICD-10-CM | POA: Diagnosis not present

## 2023-08-31 DIAGNOSIS — Z96652 Presence of left artificial knee joint: Secondary | ICD-10-CM | POA: Diagnosis not present

## 2023-08-31 DIAGNOSIS — Z87891 Personal history of nicotine dependence: Secondary | ICD-10-CM | POA: Diagnosis not present

## 2023-09-01 DIAGNOSIS — E559 Vitamin D deficiency, unspecified: Secondary | ICD-10-CM | POA: Diagnosis not present

## 2023-09-01 DIAGNOSIS — Z96652 Presence of left artificial knee joint: Secondary | ICD-10-CM | POA: Diagnosis not present

## 2023-09-01 DIAGNOSIS — Z9181 History of falling: Secondary | ICD-10-CM | POA: Diagnosis not present

## 2023-09-01 DIAGNOSIS — G8929 Other chronic pain: Secondary | ICD-10-CM | POA: Diagnosis not present

## 2023-09-01 DIAGNOSIS — K219 Gastro-esophageal reflux disease without esophagitis: Secondary | ICD-10-CM | POA: Diagnosis not present

## 2023-09-01 DIAGNOSIS — Z7983 Long term (current) use of bisphosphonates: Secondary | ICD-10-CM | POA: Diagnosis not present

## 2023-09-01 DIAGNOSIS — G4730 Sleep apnea, unspecified: Secondary | ICD-10-CM | POA: Diagnosis not present

## 2023-09-01 DIAGNOSIS — H409 Unspecified glaucoma: Secondary | ICD-10-CM | POA: Diagnosis not present

## 2023-09-01 DIAGNOSIS — L570 Actinic keratosis: Secondary | ICD-10-CM | POA: Diagnosis not present

## 2023-09-01 DIAGNOSIS — E782 Mixed hyperlipidemia: Secondary | ICD-10-CM | POA: Diagnosis not present

## 2023-09-01 DIAGNOSIS — K5790 Diverticulosis of intestine, part unspecified, without perforation or abscess without bleeding: Secondary | ICD-10-CM | POA: Diagnosis not present

## 2023-09-01 DIAGNOSIS — M1711 Unilateral primary osteoarthritis, right knee: Secondary | ICD-10-CM | POA: Diagnosis not present

## 2023-09-01 DIAGNOSIS — Z471 Aftercare following joint replacement surgery: Secondary | ICD-10-CM | POA: Diagnosis not present

## 2023-09-01 DIAGNOSIS — Z87891 Personal history of nicotine dependence: Secondary | ICD-10-CM | POA: Diagnosis not present

## 2023-09-01 DIAGNOSIS — G43119 Migraine with aura, intractable, without status migrainosus: Secondary | ICD-10-CM | POA: Diagnosis not present

## 2023-09-01 DIAGNOSIS — Z7982 Long term (current) use of aspirin: Secondary | ICD-10-CM | POA: Diagnosis not present

## 2023-09-01 DIAGNOSIS — E538 Deficiency of other specified B group vitamins: Secondary | ICD-10-CM | POA: Diagnosis not present

## 2023-09-01 DIAGNOSIS — M810 Age-related osteoporosis without current pathological fracture: Secondary | ICD-10-CM | POA: Diagnosis not present

## 2023-09-01 DIAGNOSIS — Z96642 Presence of left artificial hip joint: Secondary | ICD-10-CM | POA: Diagnosis not present

## 2023-09-01 LAB — ANTIBODY SCREEN: Antibody Screen: NOT DETECTED

## 2023-09-01 LAB — RHEUMATOID FACTOR (IGA, IGG, IGM)
Rheumatoid Factor (IgA): <5 U (ref <=6)
Rheumatoid Factor (IgG): <5 U (ref <=6)
Rheumatoid Factor (IgM): <5 U (ref <=6)

## 2023-09-01 LAB — ABO

## 2023-09-03 ENCOUNTER — Other Ambulatory Visit: Payer: Self-pay

## 2023-09-03 DIAGNOSIS — K5790 Diverticulosis of intestine, part unspecified, without perforation or abscess without bleeding: Secondary | ICD-10-CM | POA: Diagnosis not present

## 2023-09-03 DIAGNOSIS — E538 Deficiency of other specified B group vitamins: Secondary | ICD-10-CM | POA: Diagnosis not present

## 2023-09-03 DIAGNOSIS — G4730 Sleep apnea, unspecified: Secondary | ICD-10-CM | POA: Diagnosis not present

## 2023-09-03 DIAGNOSIS — L570 Actinic keratosis: Secondary | ICD-10-CM | POA: Diagnosis not present

## 2023-09-03 DIAGNOSIS — M1711 Unilateral primary osteoarthritis, right knee: Secondary | ICD-10-CM | POA: Diagnosis not present

## 2023-09-03 DIAGNOSIS — G8929 Other chronic pain: Secondary | ICD-10-CM | POA: Diagnosis not present

## 2023-09-03 DIAGNOSIS — Z7983 Long term (current) use of bisphosphonates: Secondary | ICD-10-CM | POA: Diagnosis not present

## 2023-09-03 DIAGNOSIS — E559 Vitamin D deficiency, unspecified: Secondary | ICD-10-CM | POA: Diagnosis not present

## 2023-09-03 DIAGNOSIS — Z7982 Long term (current) use of aspirin: Secondary | ICD-10-CM | POA: Diagnosis not present

## 2023-09-03 DIAGNOSIS — Z9181 History of falling: Secondary | ICD-10-CM | POA: Diagnosis not present

## 2023-09-03 DIAGNOSIS — K219 Gastro-esophageal reflux disease without esophagitis: Secondary | ICD-10-CM | POA: Diagnosis not present

## 2023-09-03 DIAGNOSIS — G43119 Migraine with aura, intractable, without status migrainosus: Secondary | ICD-10-CM | POA: Diagnosis not present

## 2023-09-03 DIAGNOSIS — Z87891 Personal history of nicotine dependence: Secondary | ICD-10-CM | POA: Diagnosis not present

## 2023-09-03 DIAGNOSIS — Z96652 Presence of left artificial knee joint: Secondary | ICD-10-CM | POA: Diagnosis not present

## 2023-09-03 DIAGNOSIS — M810 Age-related osteoporosis without current pathological fracture: Secondary | ICD-10-CM | POA: Diagnosis not present

## 2023-09-03 DIAGNOSIS — Z96642 Presence of left artificial hip joint: Secondary | ICD-10-CM | POA: Diagnosis not present

## 2023-09-03 DIAGNOSIS — Z471 Aftercare following joint replacement surgery: Secondary | ICD-10-CM | POA: Diagnosis not present

## 2023-09-03 DIAGNOSIS — E782 Mixed hyperlipidemia: Secondary | ICD-10-CM | POA: Diagnosis not present

## 2023-09-03 DIAGNOSIS — H409 Unspecified glaucoma: Secondary | ICD-10-CM | POA: Diagnosis not present

## 2023-09-05 DIAGNOSIS — E538 Deficiency of other specified B group vitamins: Secondary | ICD-10-CM | POA: Diagnosis not present

## 2023-09-05 DIAGNOSIS — Z96652 Presence of left artificial knee joint: Secondary | ICD-10-CM | POA: Diagnosis not present

## 2023-09-05 DIAGNOSIS — E559 Vitamin D deficiency, unspecified: Secondary | ICD-10-CM | POA: Diagnosis not present

## 2023-09-05 DIAGNOSIS — G4730 Sleep apnea, unspecified: Secondary | ICD-10-CM | POA: Diagnosis not present

## 2023-09-05 DIAGNOSIS — Z87891 Personal history of nicotine dependence: Secondary | ICD-10-CM | POA: Diagnosis not present

## 2023-09-05 DIAGNOSIS — Z9181 History of falling: Secondary | ICD-10-CM | POA: Diagnosis not present

## 2023-09-05 DIAGNOSIS — G8929 Other chronic pain: Secondary | ICD-10-CM | POA: Diagnosis not present

## 2023-09-05 DIAGNOSIS — Z7983 Long term (current) use of bisphosphonates: Secondary | ICD-10-CM | POA: Diagnosis not present

## 2023-09-05 DIAGNOSIS — Z7982 Long term (current) use of aspirin: Secondary | ICD-10-CM | POA: Diagnosis not present

## 2023-09-05 DIAGNOSIS — H409 Unspecified glaucoma: Secondary | ICD-10-CM | POA: Diagnosis not present

## 2023-09-05 DIAGNOSIS — M810 Age-related osteoporosis without current pathological fracture: Secondary | ICD-10-CM | POA: Diagnosis not present

## 2023-09-05 DIAGNOSIS — M1711 Unilateral primary osteoarthritis, right knee: Secondary | ICD-10-CM | POA: Diagnosis not present

## 2023-09-05 DIAGNOSIS — K219 Gastro-esophageal reflux disease without esophagitis: Secondary | ICD-10-CM | POA: Diagnosis not present

## 2023-09-05 DIAGNOSIS — L570 Actinic keratosis: Secondary | ICD-10-CM | POA: Diagnosis not present

## 2023-09-05 DIAGNOSIS — Z471 Aftercare following joint replacement surgery: Secondary | ICD-10-CM | POA: Diagnosis not present

## 2023-09-05 DIAGNOSIS — K5790 Diverticulosis of intestine, part unspecified, without perforation or abscess without bleeding: Secondary | ICD-10-CM | POA: Diagnosis not present

## 2023-09-05 DIAGNOSIS — Z96642 Presence of left artificial hip joint: Secondary | ICD-10-CM | POA: Diagnosis not present

## 2023-09-05 DIAGNOSIS — G43119 Migraine with aura, intractable, without status migrainosus: Secondary | ICD-10-CM | POA: Diagnosis not present

## 2023-09-05 DIAGNOSIS — E782 Mixed hyperlipidemia: Secondary | ICD-10-CM | POA: Diagnosis not present

## 2023-09-05 NOTE — Patient Instructions (Signed)
 Thank you for allowing the Complex Care Management team to participate in your care. It was great speaking with you. ]  We will follow up on September 19, 2023 at 1230. Please do not hesitate to contact me if you require assistance prior to our next outreach.    Jackson Acron [facility] Population Health RN Care Manager Direct Dial: [phone]  Fax: [phone] Website: [email or web address]

## 2023-09-05 NOTE — Patient Outreach (Signed)
 Complex Care Management   Visit Note  09/05/2023  Name:  Diana Mcneil MRN: [id number] DOB: 05/16/1945  Situation: Referral received for Complex Care Management related to COPD and Hypertension. I obtained verbal consent from Patient.  Visit completed with Diana Mcneil via telephone.  Background:   Past Medical History:  Diagnosis Date   Allergy    Anxiety    Headache    Hyperlipidemia    Migraine    Sleep apnea     Assessment: Patient Reported Symptoms: Cognitive Cognitive Status: Alert and oriented to person, place, and time, Normal speech and language skills Cognitive/Intellectual Conditions Management [RPT]: None reported or documented in medical history or problem list Health Maintenance Behaviors: Annual physical exam, Stress management, Social activities Healing Pattern: Average Health Facilitated by: Pain control, Rest, Stress management  Neurological Neurological Review of Symptoms: No symptoms reported Neurological Management Strategies: Routine screening, Medication therapy, Coping strategies Neurological Self-Management Outcome: 4 (good)  HEENT HEENT Symptoms Reported: No symptoms reported HEENT Management Strategies: Routine screening HEENT Self-Management Outcome: 4 (good)  Cardiovascular Cardiovascular Symptoms Reported: Swelling in legs or feet (Reports lower extremity. Notes discussing with the [facility] team and edema is likely due to recent hip replacement on July 24th. Reviewed indications for seeking medical attention.) Does patient have uncontrolled Hypertension?: No  Respiratory Respiratory Symptoms Reported: No symptoms reported  Endocrine Endocrine Symptoms Reported: No symptoms reported Is patient diabetic?: No Endocrine Self-Management Outcome: 4 (good)  Gastrointestinal Gastrointestinal Symptoms Reported: No symptoms reported Gastrointestinal Self-Management Outcome: 4 (good)  Genitourinary Genitourinary Symptoms Reported:  Incontinence Genitourinary Management Strategies: Incontinence garment/pad Genitourinary Self-Management Outcome: 4 (good)  Integumentary Integumentary Symptoms Reported: Incision Additional Integumentary Details: Reports surgical dressing to left hip. 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 Reports dressing is dry and intact. Reviewed indications for notifying the [facility] team and seeking medical attention. Skin Management Strategies: Coping strategies, Dressing changes, Medication therapy, Routine screening  Musculoskeletal Musculoskelatal Symptoms Reviewed: Unsteady gait Additional Musculoskeletal Details: Completed left hip replacement on August 30, 2023 with [facility]. Plan is to complete physical therapy twice a week. Reports currently doing well and utilizing rollator walker as instructed. Musculoskeletal Management Strategies: Adequate rest, Coping strategies, Medical device, Medication therapy, Routine screening (Physical Therapy) Musculoskeletal Self-Management Outcome: 4 (good)  Psychosocial Psychosocial Symptoms Reported: No symptoms reported Quality of Family Relationships: helpful, involved, supportive Do you feel physically threatened by others?: No      09/03/2023    5:14 PM  Depression screen PHQ 2/9  Decreased Interest 0  Down, Depressed, Hopeless 0  PHQ - 2 Score 0    There were no vitals filed for this visit.  Medications Reviewed Today     Reviewed by Karoline Lima, RN (Registered Nurse) on 09/03/23 at 1644  Med List Status: <None>   Medication Order Taking? Sig Documenting Provider Last Dose Status Informant  alendronate  (FOSAMAX ) 70 MG tablet [id number]  TAKE 1 TABLET BY MOUTH WEEKLY  WITH 8 OZ OF PLAIN WATER 30  MINUTES BEFORE FIRST FOOD, DRINK OR MEDS. STAY UPRIGHT FOR 30  MINS Edman Marsa PARAS, DO  Active   amitriptyline  (ELAVIL ) 10 MG tablet [id number] No TAKE 1 TABLET(10 MG) BY MOUTH AT BEDTIME Edman Marsa PARAS, DO Taking Active    ascorbic acid (VITAMIN C) 500 MG tablet [id number] No Take by mouth. [provider] Taking Active   aspirin 81 MG EC tablet [id number] No Take by mouth. [provider] Taking Active   azelastine  (ASTELIN ) 0.1 % nasal spray  [id number] No Place 2 sprays into both nostrils 2 (two) times daily. Use in each nostril as directed Edman Marsa PARAS, DO Taking Active   azithromycin  (ZITHROMAX  Z-PAK) 250 MG tablet [id number]  Take 2 tabs (500mg  total) on Day 1. Take 1 tab (250mg ) daily for next 4 days. Edman Marsa PARAS, DO  Active   Cholecalciferol 25 MCG (1000 UT) tablet [id number] No Take by mouth. [provider] Taking Active   DULoxetine  (CYMBALTA ) 30 MG capsule [id number] No TAKE 1 CAPSULE BY MOUTH TWICE  DAILY Karamalegos, Marsa PARAS, DO Taking Active   fluconazole  (DIFLUCAN ) 150 MG tablet [id number] No Take one tablet by mouth on Day 1. Repeat dose 2nd tablet on Day 3.  Patient not taking: Reported on 06/05/2023   Edman Marsa PARAS, DO Not Taking Active   fluticasone  (FLONASE ) 50 MCG/ACT nasal spray [id number] No USE 2 SPRAYS IN BOTH NOSTRILS  DAILY Edman Marsa PARAS, DO Taking Active   gabapentin  (NEURONTIN ) 100 MG capsule [id number]  TAKE 1 CAPSULE BY MOUTH TWICE  DAILY Karamalegos, Marsa PARAS, DO  Active   meloxicam  (MOBIC ) 7.5 MG tablet [id number] No Take 1 tablet (7.5 mg total) by mouth daily as needed for pain. Alvia Selinda PARAS, MD Taking Active   montelukast  (SINGULAIR ) 10 MG tablet [id number]  TAKE 1 TABLET BY MOUTH AT  BEDTIME Edman Marsa PARAS, DO  Active   nystatin  (MYCOSTATIN /NYSTOP ) powder [id number] No Apply 1 Application topically 3 (three) times daily. For 2-4 weeks. May repeat if needed. Edman Marsa PARAS, DO Taking Active            Med Note HUMBERTO, JHONNIE RAMAN   Fri Aug 25, 2022  2:35 PM) Takes PRN   pantoprazole  (PROTONIX ) 40 MG tablet [id number]  TAKE 1 TABLET BY MOUTH DAILY  BEFORE BREAKFAST Karamalegos, Marsa PARAS, DO   Active   rizatriptan  (MAXALT -MLT) 10 MG disintegrating tablet [id number] No Take 1 tablet (10 mg total) by mouth as needed for migraine. May repeat in 2 hours if needed Edman Marsa PARAS, DO Taking Active   rosuvastatin  (CRESTOR ) 20 MG tablet [id number] No TAKE 1 TABLET BY MOUTH AT  BEDTIME Edman Marsa PARAS, DO Taking Active   triamcinolone  (KENALOG ) 0.025 % ointment [id number] No Apply 1 Application topically 2 (two) times daily. Leigh Sober, MD Taking Active   triamcinolone  ointment (KENALOG ) 0.5 % [id number] No Apply 1 Application topically 2 (two) times daily. For 1-2 weeks affected area only then stop. Edman Marsa PARAS, DO Taking Active             Recommendation:   Continue Current Plan of Care  Follow Up Plan:   Telephone follow up appointment with Nurse Case Manager on September 19, 2023   Jackson Acron [facility] RN Care Manager Direct Dial: [phone]  Fax: [phone] Website: [email or web address]

## 2023-09-06 ENCOUNTER — Ambulatory Visit: Admission: RE | Admit: 2023-09-06 | Source: Ambulatory Visit

## 2023-09-06 ENCOUNTER — Other Ambulatory Visit: Payer: Self-pay | Admitting: Orthopedic Surgery

## 2023-09-06 DIAGNOSIS — R2242 Localized swelling, mass and lump, left lower limb: Secondary | ICD-10-CM

## 2023-09-07 ENCOUNTER — Ambulatory Visit: Payer: 59

## 2023-09-07 DIAGNOSIS — Z0000 Encounter for general adult medical examination without abnormal findings: Secondary | ICD-10-CM

## 2023-09-07 DIAGNOSIS — Z1231 Encounter for screening mammogram for malignant neoplasm of breast: Secondary | ICD-10-CM

## 2023-09-07 NOTE — Progress Notes (Signed)
 Subjective:   Diana Mcneil is a 78 y.o. who presents for a Medicare Wellness preventive visit.  As a reminder, Annual Wellness Visits don't include a physical exam, and some assessments may be limited, especially if this visit is performed virtually. We may recommend an in-person follow-up visit with your provider if needed.  Visit Complete: Virtual I connected with  Elveria Doom on 09/07/23 by a audio enabled telemedicine application and verified that I am speaking with the correct person using two identifiers.  Patient Location: Home  Provider Location: Home Office  I discussed the limitations of evaluation and management by telemedicine. The patient expressed understanding and agreed to proceed.  Vital Signs: Because this visit was a virtual/telehealth visit, some criteria may be missing or patient reported. Any vitals not documented were not able to be obtained and vitals that have been documented are patient reported.  VideoDeclined- This patient declined Librarian, academic. Therefore the visit was completed with audio only.  Persons Participating in Visit: Patient.  AWV Questionnaire: No: Patient Medicare AWV questionnaire was not completed prior to this visit.  Cardiac Risk Factors include: advanced age (>3men, >20 women);dyslipidemia;obesity (BMI >30kg/m2);sedentary lifestyle     Objective:    Today's Vitals   09/07/23 1244  PainSc: 5    There is no height or weight on file to calculate BMI.     09/07/2023   12:51 PM 08/25/2022    2:40 PM 07/20/2020    1:29 PM  Advanced Directives  Does Patient Have a Medical Advance Directive? No No Yes  Type of Best boy of [place];Living will  Copy of Healthcare Power of Attorney in Chart?   No - copy requested  Would patient like information on creating a medical advance directive? No - Patient declined No - Patient declined     Current Medications  (verified) Outpatient Encounter Medications as of 09/07/2023  Medication Sig   alendronate  (FOSAMAX ) 70 MG tablet TAKE 1 TABLET BY MOUTH WEEKLY  WITH 8 OZ OF PLAIN WATER 30  MINUTES BEFORE FIRST FOOD, DRINK OR MEDS. STAY UPRIGHT FOR 30  MINS   amitriptyline  (ELAVIL ) 10 MG tablet TAKE 1 TABLET(10 MG) BY MOUTH AT BEDTIME   ascorbic acid (VITAMIN C) 500 MG tablet Take by mouth.   aspirin 81 MG EC tablet Take by mouth.   azelastine  (ASTELIN ) 0.1 % nasal spray Place 2 sprays into both nostrils 2 (two) times daily. Use in each nostril as directed   Cholecalciferol 25 MCG (1000 UT) tablet Take by mouth.   DULoxetine  (CYMBALTA ) 30 MG capsule TAKE 1 CAPSULE BY MOUTH TWICE  DAILY   fluticasone  (FLONASE ) 50 MCG/ACT nasal spray USE 2 SPRAYS IN BOTH NOSTRILS  DAILY   gabapentin  (NEURONTIN ) 100 MG capsule TAKE 1 CAPSULE BY MOUTH TWICE  DAILY   meloxicam  (MOBIC ) 7.5 MG tablet Take 1 tablet (7.5 mg total) by mouth daily as needed for pain.   montelukast  (SINGULAIR ) 10 MG tablet TAKE 1 TABLET BY MOUTH AT  BEDTIME   nystatin  (MYCOSTATIN /NYSTOP ) powder Apply 1 Application topically 3 (three) times daily. For 2-4 weeks. May repeat if needed.   pantoprazole  (PROTONIX ) 40 MG tablet TAKE 1 TABLET BY MOUTH DAILY  BEFORE BREAKFAST   rizatriptan  (MAXALT -MLT) 10 MG disintegrating tablet Take 1 tablet (10 mg total) by mouth as needed for migraine. May repeat in 2 hours if needed   rosuvastatin  (CRESTOR ) 20 MG tablet TAKE 1 TABLET BY MOUTH AT  BEDTIME  triamcinolone  (KENALOG ) 0.025 % ointment Apply 1 Application topically 2 (two) times daily.   triamcinolone  ointment (KENALOG ) 0.5 % Apply 1 Application topically 2 (two) times daily. For 1-2 weeks affected area only then stop.   azithromycin  (ZITHROMAX  Z-PAK) 250 MG tablet Take 2 tabs (500mg  total) on Day 1. Take 1 tab (250mg ) daily for next 4 days. (Patient not taking: Reported on 09/07/2023)   fluconazole  (DIFLUCAN ) 150 MG tablet Take one tablet by mouth on Day 1. Repeat dose  2nd tablet on Day 3. (Patient not taking: Reported on 06/05/2023)   No facility-administered encounter medications on file as of 09/07/2023.    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   Family History  Problem Relation Age of Onset   Diabetes Mother    Heart disease Mother    Stroke Mother    CAD Mother    Diabetes Father    Diabetes Paternal Grandfather    Social History   Socioeconomic History   Marital status: Single    Spouse name: Not on file   Number of children: Not on file   Years of education: high school   Highest education level: High school graduate  Occupational History   Not on file  Tobacco Use   Smoking status: Former    Current packs/day: 0.50    Average packs/day: 0.5 packs/day for 40.0 years (20.0 ttl pk-yrs)    Types: Cigarettes   Smokeless tobacco: Former  Building services engineer status: Never Used  Substance and Sexual Activity   Alcohol use: Never   Drug use: Never   Sexual activity: Yes  Other Topics Concern   Not on file  Social History Narrative   Not on file   Social Drivers of Health   Financial Resource Strain: Low Risk  (09/07/2023)   Overall Financial Resource Strain (CARDIA)    Difficulty of Paying Living Expenses: Not very hard  Food Insecurity: No Food Insecurity (09/07/2023)   Hunger Vital Sign    Worried About Running Out of Food in the Last Year: Never true    Ran Out of Food in the Last Year: Never true  Transportation Needs: No Transportation Needs (09/07/2023)   PRAPARE -  Administrator, Civil Service (Medical): No    Lack of Transportation (Non-Medical): No  Physical Activity: Insufficiently Active (09/07/2023)   Exercise Vital Sign    Days of Exercise per Week: 2 days    Minutes of Exercise per Session: 20 min  Stress: Stress Concern Present (09/07/2023)   [employer] of Occupational Health - Occupational Stress Questionnaire    Feeling of Stress: To some extent  Social Connections: Moderately Isolated (09/07/2023)   Social Connection and Isolation Panel    Frequency of Communication with Friends and Family: More than three times a week    Frequency of Social Gatherings with Friends and Family: Never    Attends Religious Services: Never    Database administrator or Organizations:  No    Attends Banker Meetings: Never    Marital Status: Living with partner    Tobacco Counseling Counseling given: Not Answered    Clinical Intake:  Pre-visit preparation completed: Yes  Pain : 0-10 Pain Score: 5  Pain Type: Acute pain Pain Location: Leg Pain Orientation: Left Pain Radiating Towards: just had surgery- foot swollen Pain Descriptors / Indicators: Discomfort, Tender, Radiating, Operative site guarding Pain Onset: 1 to 4 weeks ago Pain Frequency: Constant     BMI - recorded: 37 Nutritional Status: BMI > 30  Obese Nutritional Risks: None Diabetes: No  Lab Results  Component Value Date   HGBA1C 5.0 08/02/2023   HGBA1C 5.2 06/07/2022   HGBA1C 5.2 06/01/2021     How often do you need to have someone help you when you read instructions, pamphlets, or other written materials from your doctor or pharmacy?: 2 - Rarely  Interpreter Needed?: No  Information entered by :: JHONNIE DAS, LPN   Activities of Daily Living     09/07/2023   12:55 PM 10/03/2022    4:16 PM  In your present state of health, do you have any difficulty performing the following activities:  Hearing? 0 0  Vision? 0 1  Difficulty concentrating  or making decisions? 0 1  Walking or climbing stairs? 1 1  Dressing or bathing? 0 0  Doing errands, shopping? 0 1  Preparing Food and eating ? N   Using the Toilet? N   In the past six months, have you accidently leaked urine? N   Do you have problems with loss of bowel control? N   Managing your Medications? N   Managing your Finances? N   Housekeeping or managing your Housekeeping? Y     Patient Care Team: Edman Marsa PARAS, DO as PCP - General (Family Medicine) Darliss Rogue, MD as PCP - Cardiology (Cardiology) Karoline Lima, RN as Case Manager  I have updated your Care Teams any recent Medical Services you may have received from other providers in the past year.     Assessment:   This is a routine wellness examination for [place].  Hearing/Vision screen Hearing Screening - Comments:: NO AIDS Vision Screening - Comments:: WEARS GLASSES ALL DAY-    Goals Addressed             This Visit's Progress    DIET - INCREASE WATER INTAKE         Depression Screen     09/07/2023   12:49 PM 09/03/2023    5:14 PM 08/22/2023    2:43 PM 08/22/2023    2:29 PM 06/05/2023    3:26 PM 02/05/2023    3:49 PM 10/03/2022    4:15 PM  PHQ 2/9 Scores  PHQ - 2 Score 0 0 0 0 1 1 1   PHQ- 9 Score 2    4 4 9     Fall Risk     09/07/2023   12:55 PM 08/22/2023    2:39 PM 06/05/2023    3:26 PM 10/03/2022    4:16 PM 08/25/2022    2:41 PM  Fall Risk   Falls in the past year? 0 0 0 0 0  Number falls in past yr: 0   0 0  Injury with Fall? 0 0  0 0  Risk for fall due to : Orthopedic patient;Impaired balance/gait Medication side effect;Orthopedic patient;Impaired balance/gait  No Fall Risks No Fall Risks  Follow up Falls evaluation completed;Falls prevention discussed Falls prevention discussed  Falls evaluation completed Falls prevention discussed;Falls evaluation completed    MEDICARE RISK AT HOME:  Medicare Risk at Home Any stairs in or around the home?: No If so, are there any  without handrails?: No Home free of loose throw rugs in walkways, pet beds, electrical cords, etc?: Yes Adequate lighting in your home to reduce risk of falls?: Yes Life alert?: No Use of a cane, walker or w/c?: Yes (ROLLATOR) Grab bars in the bathroom?: Yes Shower chair or bench in shower?: No Elevated toilet seat or a handicapped toilet?: No  TIMED UP AND GO:  Was the test performed?  No  Cognitive Function: 6CIT completed        09/07/2023   12:57 PM 08/25/2022    2:42 PM 08/11/2021    3:03 PM 07/20/2020    1:34 PM  6CIT Screen  What Year? 0 points 0 points 0 points 0 points  What month? 0 points 0 points 0 points 0 points  What time? 0 points 3 points 0 points 0 points  Count back from 20 0 points 0 points 0 points 0 points  Months in reverse 0 points 0 points 0 points 0 points  Repeat phrase 0 points 0 points 0 points 4 points  Total Score 0 points 3 points 0 points 4 points    Immunizations Immunization History  Administered Date(s) Administered   Fluad Quad(high Dose 65+) 11/10/2019, 01/16/2022   Fluad Trivalent(High Dose 65+) 10/26/2022   Fluzone Influenza virus vaccine,trivalent (IIV3), split virus 03/13/2016   Influenza, High Dose Seasonal PF 12/18/2016   Pneumococcal Conjugate-13 08/07/2018   Pneumococcal Polysaccharide-23 12/18/2016   Tdap 04/05/2022   Zoster Recombinant(Shingrix ) 08/01/2019, 06/06/2021    Screening Tests Health Maintenance  Topic Date Due   MAMMOGRAM  06/21/2021   INFLUENZA VACCINE  09/07/2023   Medicare Annual Wellness (AWV)  09/06/2024   DEXA SCAN  12/03/2024   DTaP/Tdap/Td (2 - Td or Tdap) 04/05/2032   Pneumococcal Vaccine: 50+ Years  Completed   Hepatitis C Screening  Completed   Zoster Vaccines- Shingrix   Completed   Hepatitis B Vaccines  Aged Out   HPV VACCINES  Aged Out   Meningococcal B Vaccine  Aged Out   COVID-19 Vaccine  Discontinued    Health Maintenance  Health Maintenance Due  Topic Date Due   MAMMOGRAM   06/21/2021   INFLUENZA VACCINE  09/07/2023   Health Maintenance Items Addressed: UP TO DATE ON SHOTS BUT DECLINES COVIDS;MAMMOGRAM ORDERED; UP TO DATE ON BDS; AGED OUT OF COLONOSCOPY   Additional Screening:  Vision Screening: Recommended annual ophthalmology exams for early detection of glaucoma and other disorders of the eye. Would you like a referral to an eye doctor? No    Dental Screening: Recommended annual dental exams for proper oral hygiene  Community Resource Referral / Chronic Care Management: CRR required this visit?  No   CCM required this visit?  No   Plan:    I have personally reviewed and noted the following in the patient's chart:   Medical and social history Use of alcohol, tobacco or illicit drugs  Current medications and supplements including opioid prescriptions. Patient is not currently taking opioid prescriptions. Functional ability and status Nutritional status Physical activity Advanced directives List of other physicians Hospitalizations, surgeries, and ER visits in previous 12 months Vitals Screenings to include cognitive, depression, and falls Referrals and appointments  In addition, I have reviewed and discussed with patient certain preventive protocols, quality metrics, and best practice recommendations. A  written personalized care plan for preventive services as well as general preventive health recommendations were provided to patient.   Jhonnie GORMAN Das, LPN   02/14/7972   After Visit Summary: (MyChart) Due to this being a telephonic visit, the after visit summary with patients personalized plan was offered to patient via MyChart   Notes: MAMMOGRAM ORDERED

## 2023-09-07 NOTE — Patient Instructions (Addendum)
 Diana Mcneil , Thank you for taking time out of your busy schedule to complete your Annual Wellness Visit with me. I enjoyed our conversation and look forward to speaking with you again next year. I, as well as your care team,  appreciate your ongoing commitment to your health goals. Please review the following plan we discussed and let me know if I can assist you in the future.  REFERRAL FOR MAMMOGRAM SENT You have an order for:  []   2D Mammogram  [x]   3D Mammogram  []   Bone Density     Please call for appointment:  [facility]  [address] [place] [address] [phone] [facility] [address] [place], [place] [address] [phone] [facility] [address] [place], [place] [address] [phone]   Make sure to wear two-piece clothing.  No lotions, powders, or deodorants the day of the appointment. Make sure to bring picture ID and insurance card.  Bring list of medications you are currently taking including any supplements.   Schedule your [place] screening mammogram through MyChart!   Log into your MyChart account.  Go to 'Visit' (or 'Appointments' if on mobile App) --> Schedule an Appointment  Under 'Select a Reason for Visit' choose the Mammogram Screening option.  Complete the pre-visit questions and select the time and place that best fits your schedule.   Follow up Visits: 09/19/24 @ 9:30 AM BY PHONE We will see or speak with you next year for your Next Medicare AWV with our clinical staff Have you seen your provider in the last 6 months (3 months if uncontrolled diabetes)? Yes  Clinician Recommendations:  Aim for 30 minutes of exercise or brisk walking, 6-8 glasses of water, and 5 servings of fruits and vegetables each day. TAKE CARE!      This is a list of the screenings recommended for you:  Health Maintenance  Topic Date  Due   Mammogram  06/21/2021   Flu Shot  09/07/2023   Medicare Annual Wellness Visit  09/06/2024   DEXA scan (bone density measurement)  12/03/2024   DTaP/Tdap/Td vaccine (2 - Td or Tdap) 04/05/2032   Pneumococcal Vaccine for age over 66  Completed   Hepatitis C Screening  Completed   Zoster (Shingles) Vaccine  Completed   Hepatitis B Vaccine  Aged Out   HPV Vaccine  Aged Out   Meningitis B Vaccine  Aged Out   COVID-19 Vaccine  Discontinued    Advanced directives: (ACP Link)Information on Advanced Care Planning can be found at [place]  Secretary of [facility] Advance Health Care Directives Advance Health Care Directives. [email or web address]  Advance Care Planning is important because it:  [x]  Makes sure you receive the medical care that is consistent with your values, goals, and preferences  [x]  It provides guidance to your family and loved ones and reduces their decisional burden about whether or not they are making the right decisions based on your wishes.  Follow the link provided in your after visit summary or read over the paperwork we have mailed to you to help you started getting your Advance Directives in place. If you need assistance in completing these, please reach out to us  so that we can help you!

## 2023-09-08 DIAGNOSIS — Z7982 Long term (current) use of aspirin: Secondary | ICD-10-CM | POA: Diagnosis not present

## 2023-09-08 DIAGNOSIS — E538 Deficiency of other specified B group vitamins: Secondary | ICD-10-CM | POA: Diagnosis not present

## 2023-09-08 DIAGNOSIS — Z9181 History of falling: Secondary | ICD-10-CM | POA: Diagnosis not present

## 2023-09-08 DIAGNOSIS — E782 Mixed hyperlipidemia: Secondary | ICD-10-CM | POA: Diagnosis not present

## 2023-09-08 DIAGNOSIS — M810 Age-related osteoporosis without current pathological fracture: Secondary | ICD-10-CM | POA: Diagnosis not present

## 2023-09-08 DIAGNOSIS — Z96652 Presence of left artificial knee joint: Secondary | ICD-10-CM | POA: Diagnosis not present

## 2023-09-08 DIAGNOSIS — G8929 Other chronic pain: Secondary | ICD-10-CM | POA: Diagnosis not present

## 2023-09-08 DIAGNOSIS — G4730 Sleep apnea, unspecified: Secondary | ICD-10-CM | POA: Diagnosis not present

## 2023-09-08 DIAGNOSIS — K5790 Diverticulosis of intestine, part unspecified, without perforation or abscess without bleeding: Secondary | ICD-10-CM | POA: Diagnosis not present

## 2023-09-08 DIAGNOSIS — E559 Vitamin D deficiency, unspecified: Secondary | ICD-10-CM | POA: Diagnosis not present

## 2023-09-08 DIAGNOSIS — Z7983 Long term (current) use of bisphosphonates: Secondary | ICD-10-CM | POA: Diagnosis not present

## 2023-09-08 DIAGNOSIS — Z96642 Presence of left artificial hip joint: Secondary | ICD-10-CM | POA: Diagnosis not present

## 2023-09-08 DIAGNOSIS — H409 Unspecified glaucoma: Secondary | ICD-10-CM | POA: Diagnosis not present

## 2023-09-08 DIAGNOSIS — Z471 Aftercare following joint replacement surgery: Secondary | ICD-10-CM | POA: Diagnosis not present

## 2023-09-08 DIAGNOSIS — G43119 Migraine with aura, intractable, without status migrainosus: Secondary | ICD-10-CM | POA: Diagnosis not present

## 2023-09-08 DIAGNOSIS — Z87891 Personal history of nicotine dependence: Secondary | ICD-10-CM | POA: Diagnosis not present

## 2023-09-08 DIAGNOSIS — K219 Gastro-esophageal reflux disease without esophagitis: Secondary | ICD-10-CM | POA: Diagnosis not present

## 2023-09-08 DIAGNOSIS — M1711 Unilateral primary osteoarthritis, right knee: Secondary | ICD-10-CM | POA: Diagnosis not present

## 2023-09-08 DIAGNOSIS — L570 Actinic keratosis: Secondary | ICD-10-CM | POA: Diagnosis not present

## 2023-09-10 ENCOUNTER — Ambulatory Visit
Admission: RE | Admit: 2023-09-10 | Discharge: 2023-09-10 | Disposition: A | Discharge status: Home / Self Care | Source: Ambulatory Visit | Attending: Orthopedic Surgery | Admitting: Orthopedic Surgery

## 2023-09-10 DIAGNOSIS — G8929 Other chronic pain: Secondary | ICD-10-CM | POA: Diagnosis not present

## 2023-09-10 DIAGNOSIS — G4730 Sleep apnea, unspecified: Secondary | ICD-10-CM | POA: Diagnosis not present

## 2023-09-10 DIAGNOSIS — M7989 Other specified soft tissue disorders: Secondary | ICD-10-CM | POA: Diagnosis not present

## 2023-09-10 DIAGNOSIS — M1711 Unilateral primary osteoarthritis, right knee: Secondary | ICD-10-CM | POA: Diagnosis not present

## 2023-09-10 DIAGNOSIS — Z9181 History of falling: Secondary | ICD-10-CM | POA: Diagnosis not present

## 2023-09-10 DIAGNOSIS — R2242 Localized swelling, mass and lump, left lower limb: Secondary | ICD-10-CM | POA: Diagnosis not present

## 2023-09-10 DIAGNOSIS — E559 Vitamin D deficiency, unspecified: Secondary | ICD-10-CM | POA: Diagnosis not present

## 2023-09-10 DIAGNOSIS — E538 Deficiency of other specified B group vitamins: Secondary | ICD-10-CM | POA: Diagnosis not present

## 2023-09-10 DIAGNOSIS — Z7983 Long term (current) use of bisphosphonates: Secondary | ICD-10-CM | POA: Diagnosis not present

## 2023-09-10 DIAGNOSIS — R600 Localized edema: Secondary | ICD-10-CM | POA: Diagnosis not present

## 2023-09-10 DIAGNOSIS — H409 Unspecified glaucoma: Secondary | ICD-10-CM | POA: Diagnosis not present

## 2023-09-10 DIAGNOSIS — Z471 Aftercare following joint replacement surgery: Secondary | ICD-10-CM | POA: Diagnosis not present

## 2023-09-10 DIAGNOSIS — Z96652 Presence of left artificial knee joint: Secondary | ICD-10-CM | POA: Diagnosis not present

## 2023-09-10 DIAGNOSIS — Z96642 Presence of left artificial hip joint: Secondary | ICD-10-CM | POA: Diagnosis not present

## 2023-09-10 DIAGNOSIS — K5790 Diverticulosis of intestine, part unspecified, without perforation or abscess without bleeding: Secondary | ICD-10-CM | POA: Diagnosis not present

## 2023-09-10 DIAGNOSIS — Z7982 Long term (current) use of aspirin: Secondary | ICD-10-CM | POA: Diagnosis not present

## 2023-09-10 DIAGNOSIS — G43119 Migraine with aura, intractable, without status migrainosus: Secondary | ICD-10-CM | POA: Diagnosis not present

## 2023-09-10 DIAGNOSIS — L570 Actinic keratosis: Secondary | ICD-10-CM | POA: Diagnosis not present

## 2023-09-10 DIAGNOSIS — K219 Gastro-esophageal reflux disease without esophagitis: Secondary | ICD-10-CM | POA: Diagnosis not present

## 2023-09-10 DIAGNOSIS — M810 Age-related osteoporosis without current pathological fracture: Secondary | ICD-10-CM | POA: Diagnosis not present

## 2023-09-10 DIAGNOSIS — Z87891 Personal history of nicotine dependence: Secondary | ICD-10-CM | POA: Diagnosis not present

## 2023-09-10 DIAGNOSIS — E782 Mixed hyperlipidemia: Secondary | ICD-10-CM | POA: Diagnosis not present

## 2023-09-12 ENCOUNTER — Ambulatory Visit: Payer: Self-pay

## 2023-09-12 ENCOUNTER — Telehealth: Payer: Self-pay

## 2023-09-12 DIAGNOSIS — H409 Unspecified glaucoma: Secondary | ICD-10-CM | POA: Diagnosis not present

## 2023-09-12 DIAGNOSIS — Z96642 Presence of left artificial hip joint: Secondary | ICD-10-CM | POA: Diagnosis not present

## 2023-09-12 DIAGNOSIS — E559 Vitamin D deficiency, unspecified: Secondary | ICD-10-CM | POA: Diagnosis not present

## 2023-09-12 DIAGNOSIS — G8929 Other chronic pain: Secondary | ICD-10-CM | POA: Diagnosis not present

## 2023-09-12 DIAGNOSIS — Z471 Aftercare following joint replacement surgery: Secondary | ICD-10-CM | POA: Diagnosis not present

## 2023-09-12 DIAGNOSIS — K5790 Diverticulosis of intestine, part unspecified, without perforation or abscess without bleeding: Secondary | ICD-10-CM | POA: Diagnosis not present

## 2023-09-12 DIAGNOSIS — G43119 Migraine with aura, intractable, without status migrainosus: Secondary | ICD-10-CM | POA: Diagnosis not present

## 2023-09-12 DIAGNOSIS — Z7982 Long term (current) use of aspirin: Secondary | ICD-10-CM | POA: Diagnosis not present

## 2023-09-12 DIAGNOSIS — M1711 Unilateral primary osteoarthritis, right knee: Secondary | ICD-10-CM | POA: Diagnosis not present

## 2023-09-12 DIAGNOSIS — L570 Actinic keratosis: Secondary | ICD-10-CM | POA: Diagnosis not present

## 2023-09-12 DIAGNOSIS — G4730 Sleep apnea, unspecified: Secondary | ICD-10-CM | POA: Diagnosis not present

## 2023-09-12 DIAGNOSIS — K219 Gastro-esophageal reflux disease without esophagitis: Secondary | ICD-10-CM | POA: Diagnosis not present

## 2023-09-12 DIAGNOSIS — M7989 Other specified soft tissue disorders: Secondary | ICD-10-CM | POA: Insufficient documentation

## 2023-09-12 DIAGNOSIS — N939 Abnormal uterine and vaginal bleeding, unspecified: Secondary | ICD-10-CM | POA: Insufficient documentation

## 2023-09-12 DIAGNOSIS — Z87891 Personal history of nicotine dependence: Secondary | ICD-10-CM | POA: Diagnosis not present

## 2023-09-12 DIAGNOSIS — Z9181 History of falling: Secondary | ICD-10-CM | POA: Diagnosis not present

## 2023-09-12 DIAGNOSIS — E538 Deficiency of other specified B group vitamins: Secondary | ICD-10-CM | POA: Diagnosis not present

## 2023-09-12 DIAGNOSIS — M810 Age-related osteoporosis without current pathological fracture: Secondary | ICD-10-CM | POA: Diagnosis not present

## 2023-09-12 DIAGNOSIS — E782 Mixed hyperlipidemia: Secondary | ICD-10-CM | POA: Diagnosis not present

## 2023-09-12 DIAGNOSIS — Z96652 Presence of left artificial knee joint: Secondary | ICD-10-CM | POA: Diagnosis not present

## 2023-09-12 DIAGNOSIS — Z7983 Long term (current) use of bisphosphonates: Secondary | ICD-10-CM | POA: Diagnosis not present

## 2023-09-12 NOTE — Progress Notes (Unsigned)
 Progress Note  Physician: Makenna Macaluso A Vahan Wadsworth, MD   Patient contacted 09/12/23 at 12:20 by elemedicine application and verified that I am speaking with the correct person.   Patient is aware of limitations of evaluation by telemedicine The patient expressed understanding and agreed to proceed.   HPI: Diana Mcneil is a 78 y.o. female presenting on 09/13/2023 for No chief complaint on file. .  Patient is scheduled tomorrow for a visit for both swelling in her left leg and vaginal bleeding.  She has a history of by her description what seems to be some atrophic vaginitis but she actually started to have greater vaginal bleeding.  She does have an OB/GYN.  No and pain  Patient is little over weeks from hip surgery.  She has been having swelling in the same leg and it sounds like from the triage note an ulceration.  She reports that she was seen by emergency orthopedics although I cannot find a note in the chart.  They have advised her to be seen by primary care.     Social history:  Relevant past medical, surgical, family and social history reviewed and updated as indicated. Interim medical history since our last visit reviewed.  Allergies and medications reviewed and updated.  DATA REVIEWED: CHART IN EPIC  ROS: Negative unless specifically indicated above in HPI.    Current Outpatient Medications:    alendronate  (FOSAMAX ) 70 MG tablet, TAKE 1 TABLET BY MOUTH WEEKLY  WITH 8 OZ OF PLAIN WATER 30  MINUTES BEFORE FIRST FOOD, DRINK OR MEDS. STAY UPRIGHT FOR 30  MINS, Disp: 12 tablet, Rfl: 3   amitriptyline  (ELAVIL ) 10 MG tablet, TAKE 1 TABLET(10 MG) BY MOUTH AT BEDTIME, Disp: 90 tablet, Rfl: 1   ascorbic acid (VITAMIN C) 500 MG tablet, Take by mouth., Disp: , Rfl:    aspirin 81 MG EC tablet, Take by mouth., Disp: , Rfl:    azelastine  (ASTELIN ) 0.1 % nasal spray, Place 2 sprays into both nostrils 2 (two) times daily. Use in each nostril as directed, Disp: 90 mL, Rfl: 3    azithromycin  (ZITHROMAX  Z-PAK) 250 MG tablet, Take 2 tabs (500mg  total) on Day 1. Take 1 tab (250mg ) daily for next 4 days. (Patient not taking: Reported on 09/07/2023), Disp: 6 tablet, Rfl: 0   Cholecalciferol 25 MCG (1000 UT) tablet, Take by mouth., Disp: , Rfl:    DULoxetine  (CYMBALTA ) 30 MG capsule, TAKE 1 CAPSULE BY MOUTH TWICE  DAILY, Disp: 200 capsule, Rfl: 1   fluconazole  (DIFLUCAN ) 150 MG tablet, Take one tablet by mouth on Day 1. Repeat dose 2nd tablet on Day 3. (Patient not taking: Reported on 06/05/2023), Disp: 2 tablet, Rfl: 0   fluticasone  (FLONASE ) 50 MCG/ACT nasal spray, USE 2 SPRAYS IN BOTH NOSTRILS  DAILY, Disp: 48 g, Rfl: 1   gabapentin  (NEURONTIN ) 100 MG capsule, TAKE 1 CAPSULE BY MOUTH TWICE  DAILY, Disp: 200 capsule, Rfl: 1   meloxicam  (MOBIC ) 7.5 MG tablet, Take 1 tablet (7.5 mg total) by mouth daily as needed for pain., Disp: 30 tablet, Rfl: 0   montelukast  (SINGULAIR ) 10 MG tablet, TAKE 1 TABLET BY MOUTH AT  BEDTIME, Disp: 100 tablet, Rfl: 1   nystatin  (MYCOSTATIN /NYSTOP ) powder, Apply 1 Application topically 3 (three) times daily. For 2-4 weeks. May repeat if needed., Disp: 30 g, Rfl: 2   pantoprazole  (PROTONIX ) 40 MG tablet, TAKE 1 TABLET BY MOUTH DAILY  BEFORE BREAKFAST, Disp: 100 tablet,  Rfl: 1   rizatriptan  (MAXALT -MLT) 10 MG disintegrating tablet, Take 1 tablet (10 mg total) by mouth as needed for migraine. May repeat in 2 hours if needed, Disp: 10 tablet, Rfl: 2   rosuvastatin  (CRESTOR ) 20 MG tablet, TAKE 1 TABLET BY MOUTH AT  BEDTIME, Disp: 100 tablet, Rfl: 2   triamcinolone  (KENALOG ) 0.025 % ointment, Apply 1 Application topically 2 (two) times daily., Disp: 240 g, Rfl: 2   triamcinolone  ointment (KENALOG ) 0.5 %, Apply 1 Application topically 2 (two) times daily. For 1-2 weeks affected area only then stop., Disp: 30 g, Rfl: 0      Objective:  Telephone visit     Assessment & Plan:    #1 Post-menopausal vaginal bleeding.  Discussed with patient that she needs to be  seen by calling GYN allergy and that vaginal bleeding it may be indicative of a more serious issue.  This is not something that we will be appropriate for workup tomorrow.  2.  We will see her for her leg swelling and possible skin ulceration tomorrow.  Possible infectious process by triage note and we will evaluate further tomorrow with the visit in office.

## 2023-09-12 NOTE — Telephone Encounter (Signed)
 FYI Only or Action Required?: FYI only for provider.  Patient was last seen in primary care on 06/05/2023 by Edman Marsa PARAS, DO.  Called Nurse Triage reporting No chief complaint on file..  Symptoms began a week ago.  Interventions attempted: Prescription medications: gabapentin   and Rest, hydration, or home remedies.  Symptoms are: gradually worsening.  Triage Disposition: See Physician Within 24 Hours, See PCP Within 2 Weeks  Patient/caregiver understands and will follow disposition?: Yes      Copied from CRM #[id number]. 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 ONSET: When did the swelling start? (e.g., minutes, hours, days)     A week ago  2. LOCATION: What part of the leg is swollen?  Are both legs swollen or just one leg?     Left leg right about ankle 3. SEVERITY: How bad is the swelling? (e.g., localized; mild, moderate, severe)     Mod-severe-draining  4. REDNESS: Is there redness or signs of infection?     red 5. PAIN: Is the swelling painful to touch? If Yes, ask: How painful is it?   (Scale 1-10; mild, moderate or severe)     6/10 6. FEVER: Do you have a fever? If Yes, ask: What is it, how was it measured, and when did it start?      Was running a fever after being d/c from surgery  7. CAUSE: What do you think is causing the leg swelling?     unknown 8. MEDICAL HISTORY: Do you have a history of blood clots (e.g., DVT), cancer, heart failure, kidney disease, or liver failure?     no 9. RECURRENT SYMPTOM: Have you had leg swelling before? If Yes,  ask: When was the last time? What happened that time?     no 10. OTHER SYMPTOMS: Do you have any other symptoms? (e.g., chest pain, difficulty breathing)       Yellow/bloody draining  Answer Assessment - Initial Assessment Questions 1. BLEEDING SEVERITY: Describe the bleeding that you are having. How much bleeding is there?      Spotting with some clots 2. ONSET: When did the bleeding begin? Is it continuing now?     A week ago     4. ABDOMEN PAIN: Do you have any pain? How bad is the pain?  (e.g., Scale 0-10; none, mild, moderate, or severe)     Yes lower abd started hurting last night 5. BLOOD THINNERS: Do you take any blood thinners? (e.g., Coumadin/warfarin, Pradaxa/dabigatran, aspirin)     no 6. HORMONE MEDICINES: Are you taking any hormone medicines, prescription or OTC? (e.g., birth control pills, estrogen)     Triamcinolone  0.25% not on estrogen 7. CAUSE: What do you think is causing the bleeding? (e.g., recent gyn surgery, recent gyn procedure; known bleeding disorder, uterine cancer)       no 8. HEMODYNAMIC STATUS: Are you weak or feeling lightheaded? If Yes, ask: Can you stand and walk normally?        9. OTHER SYMPTOMS: What other symptoms are you having with the bleeding? (e.g., back pain, burning with urination, fever)  none  Protocols used: Vaginal Bleeding - Postmenopausal-A-AH, Leg Swelling and Edema-A-AH

## 2023-09-12 NOTE — Telephone Encounter (Signed)
 Zafirov, Clarissa A, MD to [facility] (Selected Message) CZ    09/12/23 12:23 PM Re vaginal bleeding she needs to gynecology to get scheduled with them.  We can see her for her ankle issue tomorrow   Called and spoke with patient about her appt for tomorrow. She is trying to reach out to her OBGYN for an appointment. Will discuss swelling at appt tomorrow with Dr. JENEANE.

## 2023-09-13 ENCOUNTER — Ambulatory Visit (INDEPENDENT_AMBULATORY_CARE_PROVIDER_SITE_OTHER)

## 2023-09-13 DIAGNOSIS — Z7982 Long term (current) use of aspirin: Secondary | ICD-10-CM | POA: Diagnosis not present

## 2023-09-13 DIAGNOSIS — K219 Gastro-esophageal reflux disease without esophagitis: Secondary | ICD-10-CM | POA: Diagnosis not present

## 2023-09-13 DIAGNOSIS — N939 Abnormal uterine and vaginal bleeding, unspecified: Secondary | ICD-10-CM | POA: Diagnosis not present

## 2023-09-13 DIAGNOSIS — G8929 Other chronic pain: Secondary | ICD-10-CM | POA: Diagnosis not present

## 2023-09-13 DIAGNOSIS — E559 Vitamin D deficiency, unspecified: Secondary | ICD-10-CM | POA: Diagnosis not present

## 2023-09-13 DIAGNOSIS — M7989 Other specified soft tissue disorders: Secondary | ICD-10-CM | POA: Diagnosis not present

## 2023-09-13 DIAGNOSIS — L570 Actinic keratosis: Secondary | ICD-10-CM | POA: Diagnosis not present

## 2023-09-13 DIAGNOSIS — E782 Mixed hyperlipidemia: Secondary | ICD-10-CM | POA: Diagnosis not present

## 2023-09-13 DIAGNOSIS — Z7983 Long term (current) use of bisphosphonates: Secondary | ICD-10-CM | POA: Diagnosis not present

## 2023-09-13 DIAGNOSIS — Z87891 Personal history of nicotine dependence: Secondary | ICD-10-CM | POA: Diagnosis not present

## 2023-09-13 DIAGNOSIS — H409 Unspecified glaucoma: Secondary | ICD-10-CM | POA: Diagnosis not present

## 2023-09-13 DIAGNOSIS — M1711 Unilateral primary osteoarthritis, right knee: Secondary | ICD-10-CM | POA: Diagnosis not present

## 2023-09-13 DIAGNOSIS — Z96642 Presence of left artificial hip joint: Secondary | ICD-10-CM | POA: Diagnosis not present

## 2023-09-13 DIAGNOSIS — G43119 Migraine with aura, intractable, without status migrainosus: Secondary | ICD-10-CM | POA: Diagnosis not present

## 2023-09-13 DIAGNOSIS — Z96652 Presence of left artificial knee joint: Secondary | ICD-10-CM | POA: Diagnosis not present

## 2023-09-13 DIAGNOSIS — K5790 Diverticulosis of intestine, part unspecified, without perforation or abscess without bleeding: Secondary | ICD-10-CM | POA: Diagnosis not present

## 2023-09-13 DIAGNOSIS — E538 Deficiency of other specified B group vitamins: Secondary | ICD-10-CM | POA: Diagnosis not present

## 2023-09-13 DIAGNOSIS — Z471 Aftercare following joint replacement surgery: Secondary | ICD-10-CM | POA: Diagnosis not present

## 2023-09-13 DIAGNOSIS — G4730 Sleep apnea, unspecified: Secondary | ICD-10-CM | POA: Diagnosis not present

## 2023-09-13 DIAGNOSIS — Z9181 History of falling: Secondary | ICD-10-CM | POA: Diagnosis not present

## 2023-09-13 DIAGNOSIS — M810 Age-related osteoporosis without current pathological fracture: Secondary | ICD-10-CM | POA: Diagnosis not present

## 2023-09-17 DIAGNOSIS — M1711 Unilateral primary osteoarthritis, right knee: Secondary | ICD-10-CM | POA: Diagnosis not present

## 2023-09-17 DIAGNOSIS — G4730 Sleep apnea, unspecified: Secondary | ICD-10-CM | POA: Diagnosis not present

## 2023-09-17 DIAGNOSIS — Z471 Aftercare following joint replacement surgery: Secondary | ICD-10-CM | POA: Diagnosis not present

## 2023-09-17 DIAGNOSIS — Z7983 Long term (current) use of bisphosphonates: Secondary | ICD-10-CM | POA: Diagnosis not present

## 2023-09-17 DIAGNOSIS — L570 Actinic keratosis: Secondary | ICD-10-CM | POA: Diagnosis not present

## 2023-09-17 DIAGNOSIS — K219 Gastro-esophageal reflux disease without esophagitis: Secondary | ICD-10-CM | POA: Diagnosis not present

## 2023-09-17 DIAGNOSIS — E782 Mixed hyperlipidemia: Secondary | ICD-10-CM | POA: Diagnosis not present

## 2023-09-17 DIAGNOSIS — M810 Age-related osteoporosis without current pathological fracture: Secondary | ICD-10-CM | POA: Diagnosis not present

## 2023-09-17 DIAGNOSIS — Z7982 Long term (current) use of aspirin: Secondary | ICD-10-CM | POA: Diagnosis not present

## 2023-09-17 DIAGNOSIS — K5790 Diverticulosis of intestine, part unspecified, without perforation or abscess without bleeding: Secondary | ICD-10-CM | POA: Diagnosis not present

## 2023-09-17 DIAGNOSIS — Z96642 Presence of left artificial hip joint: Secondary | ICD-10-CM | POA: Diagnosis not present

## 2023-09-17 DIAGNOSIS — Z9181 History of falling: Secondary | ICD-10-CM | POA: Diagnosis not present

## 2023-09-17 DIAGNOSIS — Z96652 Presence of left artificial knee joint: Secondary | ICD-10-CM | POA: Diagnosis not present

## 2023-09-17 DIAGNOSIS — Z87891 Personal history of nicotine dependence: Secondary | ICD-10-CM | POA: Diagnosis not present

## 2023-09-17 DIAGNOSIS — G43119 Migraine with aura, intractable, without status migrainosus: Secondary | ICD-10-CM | POA: Diagnosis not present

## 2023-09-17 DIAGNOSIS — E538 Deficiency of other specified B group vitamins: Secondary | ICD-10-CM | POA: Diagnosis not present

## 2023-09-17 DIAGNOSIS — H409 Unspecified glaucoma: Secondary | ICD-10-CM | POA: Diagnosis not present

## 2023-09-19 ENCOUNTER — Telehealth: Payer: Self-pay

## 2023-09-19 ENCOUNTER — Other Ambulatory Visit: Payer: Self-pay

## 2023-09-19 DIAGNOSIS — I863 Vulval varices: Secondary | ICD-10-CM

## 2023-09-19 MED ORDER — TRIAMCINOLONE ACETONIDE 0.025 % EX OINT: 1.0000 | TOPICAL_OINTMENT | Freq: Two times a day (BID) | CUTANEOUS | 2 refills | Status: DC

## 2023-09-19 NOTE — Patient Instructions (Signed)
 Elveria Doom - I am sorry I was unable to reach you today for our scheduled appointment. I work with Edman, Marsa PARAS, DO and am calling to support your healthcare needs. Please contact me at [phone] at your earliest convenience. I look forward to speaking with you soon.   Thank you,  Jackson Acron [facility] RN Care Manager Direct Dial: [phone]  Fax: [phone] Website: [email or web address]

## 2023-09-20 ENCOUNTER — Other Ambulatory Visit: Payer: Self-pay | Admitting: Family Medicine

## 2023-09-20 DIAGNOSIS — G8929 Other chronic pain: Secondary | ICD-10-CM

## 2023-09-20 DIAGNOSIS — F3342 Major depressive disorder, recurrent, in full remission: Secondary | ICD-10-CM

## 2023-09-20 DIAGNOSIS — M1711 Unilateral primary osteoarthritis, right knee: Secondary | ICD-10-CM

## 2023-09-21 DIAGNOSIS — E782 Mixed hyperlipidemia: Secondary | ICD-10-CM | POA: Diagnosis not present

## 2023-09-24 DIAGNOSIS — Z9181 History of falling: Secondary | ICD-10-CM | POA: Diagnosis not present

## 2023-09-24 NOTE — Telephone Encounter (Signed)
 Requested Prescriptions  Pending Prescriptions Disp Refills   DULoxetine  (CYMBALTA ) 30 MG capsule [Pharmacy Med Name: DULoxetine  HCl 30 MG Oral Capsule Delayed Release Particles] 200 capsule 0    Sig: TAKE 1 CAPSULE BY MOUTH TWICE  DAILY     Psychiatry: Antidepressants - SNRI - duloxetine  Passed - 09/24/2023  3:46 PM      Passed - Cr in normal range and within 360 days    Creat  Date Value Ref Range Status  08/02/2023 0.62 0.60 - 1.00 mg/dL Final         Passed - eGFR is 30 or above and within 360 days    GFR, Est African American  Date Value Ref Range Status  05/25/2020 90 > OR = 60 mL/min/1.85m2 Final   GFR, Est Non African American  Date Value Ref Range Status  05/25/2020 77 > OR = 60 mL/min/1.16m2 Final   eGFR  Date Value Ref Range Status  08/02/2023 92 > OR = 60 mL/min/1.85m2 Final         Passed - Completed PHQ-2 or PHQ-9 in the last 360 days      Passed - Last BP in normal range    BP Readings from Last 1 Encounters:  06/05/23 124/70         Passed - Valid encounter within last 6 months    Recent Outpatient Visits           1 week ago Vaginal bleeding   [place] [facility] [place], [place] A, MD   3 months ago Chronic knee pain after total replacement of left knee joint   [place] [facility] [place], Marsa PARAS, [place]

## 2023-09-27 ENCOUNTER — Ambulatory Visit: Admitting: Obstetrics

## 2023-10-12 ENCOUNTER — Other Ambulatory Visit: Payer: Self-pay | Admitting: Family Medicine

## 2023-10-12 DIAGNOSIS — G43909 Migraine, unspecified, not intractable, without status migrainosus: Secondary | ICD-10-CM

## 2023-10-12 MED ORDER — RIZATRIPTAN BENZOATE 10 MG PO TBDP: 10.0000 mg | ORAL_TABLET | ORAL | 1 refills | Status: DC | PRN

## 2023-10-12 NOTE — Telephone Encounter (Signed)
 Copied from CRM [id number]. Topic: Clinical - Medication Question >> Oct 12, 2023 12:41 PM Avram G wrote: Reason for CRM: patient stated she takes both SUMAtriptan  and rizatriptan  (MAXALT -MLT) 10 MG disintegrating tablet [[id number]] for migraines and states she would like a new rx for   rizatriptan  (MAXALT -MLT) 10 MG disintegrating tablet [[id number]] to have more quantity.   Please advise [phone]

## 2023-10-12 NOTE — Telephone Encounter (Signed)
 Requested Prescriptions  Pending Prescriptions Disp Refills   rizatriptan  (MAXALT -MLT) 10 MG disintegrating tablet 10 tablet 1    Sig: Take 1 tablet (10 mg total) by mouth as needed for migraine. May repeat in 2 hours if needed     Neurology:  Migraine Therapy - Triptan Passed - 10/12/2023  3:57 PM      Passed - Last BP in normal range    BP Readings from Last 1 Encounters:  06/05/23 124/70         Passed - Valid encounter within last 12 months    Recent Outpatient Visits           4 weeks ago Vaginal bleeding   [place] [facility] [place], [place] A, MD   4 months ago Chronic knee pain after total replacement of left knee joint   [place] [facility] [place], Marsa PARAS, [place]

## 2023-10-15 DIAGNOSIS — Z96642 Presence of left artificial hip joint: Secondary | ICD-10-CM | POA: Diagnosis not present

## 2023-11-13 ENCOUNTER — Other Ambulatory Visit: Payer: Self-pay | Admitting: Family Medicine

## 2023-11-13 DIAGNOSIS — E785 Hyperlipidemia, unspecified: Secondary | ICD-10-CM

## 2023-11-13 DIAGNOSIS — K219 Gastro-esophageal reflux disease without esophagitis: Secondary | ICD-10-CM

## 2023-11-15 NOTE — Telephone Encounter (Signed)
 Requested Prescriptions  Pending Prescriptions Disp Refills   rosuvastatin  (CRESTOR ) 20 MG tablet [Pharmacy Med Name: Rosuvastatin  Calcium  20 MG Oral Tablet] 100 tablet 1    Sig: TAKE 1 TABLET BY MOUTH AT  BEDTIME     Cardiovascular:  Antilipid - Statins 2 Failed - 11/15/2023  1:53 PM      Failed - Lipid Panel in normal range within the last 12 months    Cholesterol  Date Value Ref Range Status  06/07/2022 144 <200 mg/dL Final   LDL Cholesterol (Calc)  Date Value Ref Range Status  06/07/2022 67 mg/dL (calc) Final    Comment:    Reference range: <100 . Desirable range <100 mg/dL for primary prevention;   <70 mg/dL for patients with CHD or diabetic patients  with > or = 2 CHD risk factors. SABRA LDL-C is now calculated using the Martin-Hopkins  calculation, which is a validated novel method providing  better accuracy than the Friedewald equation in the  estimation of LDL-C.  Gladis APPLETHWAITE et al. SANDREA. 7986;689(80): 2061-2068  (http://education.[employer].com/faq/FAQ164)    HDL  Date Value Ref Range Status  06/07/2022 59 > OR = 50 mg/dL Final   Triglycerides  Date Value Ref Range Status  06/07/2022 93 <150 mg/dL Final         Passed - Cr in normal range and within 360 days    Creat  Date Value Ref Range Status  08/02/2023 0.62 0.60 - 1.00 mg/dL Final         Passed - Patient is not pregnant      Passed - Valid encounter within last 12 months    Recent Outpatient Visits           2 months ago Vaginal bleeding   [place] [facility] [place], [place] A, MD   5 months ago Chronic knee pain after total replacement of left knee joint   [place] [facility] [place], Marsa PARAS, DO               pantoprazole  (PROTONIX ) 40 MG tablet [Pharmacy Med Name: Pantoprazole  Sodium 40 MG Oral Tablet Delayed Release] 100 tablet 1    Sig: TAKE 1 TABLET BY MOUTH DAILY  BEFORE BREAKFAST     Gastroenterology: Proton Pump Inhibitors  Passed - 11/15/2023  1:53 PM      Passed - Valid encounter within last 12 months    Recent Outpatient Visits           2 months ago Vaginal bleeding   [place] [facility] [place], [place] A, MD   5 months ago Chronic knee pain after total replacement of left knee joint   [place] [facility] [place], Marsa PARAS, [place]

## 2023-11-26 ENCOUNTER — Other Ambulatory Visit: Payer: Self-pay

## 2023-11-26 NOTE — Patient Outreach (Unsigned)
 Complex Care Management   Visit Note  11/26/2023  Name:  Diana Mcneil MRN: [id number] DOB: 06/23/1945  Situation: Referral received for Complex Care Management related to {Criteria:32550} I obtained verbal consent from {CHL AMB Patient/Caregiver:28184}.  Visit completed with {CHL AMB Patient/Caregiver:28184}  {VISIT LOCATION:32553}  Background:   Past Medical History:  Diagnosis Date   Allergy    Anxiety    Headache    Hyperlipidemia    Migraine    Sleep apnea     Assessment: Patient Reported Symptoms:  Cognitive        Neurological      HEENT        Cardiovascular      Respiratory      Endocrine      Gastrointestinal        Genitourinary      Integumentary      Musculoskeletal          Psychosocial            11/26/2023    PHQ2-9 Depression Screening   Little interest or pleasure in doing things    Feeling down, depressed, or hopeless    PHQ-2 - Total Score    Trouble falling or staying asleep, or sleeping too much    Feeling tired or having little energy    Poor appetite or overeating     Feeling bad about yourself - or that you are a failure or have let yourself or your family down    Trouble concentrating on things, such as reading the newspaper or watching television    Moving or speaking so slowly that other people could have noticed.  Or the opposite - being so fidgety or restless that you have been moving around a lot more than usual    Thoughts that you would be better off dead, or hurting yourself in some way    PHQ2-9 Total Score    If you checked off any problems, how difficult have these problems made it for you to do your work, take care of things at home, or get along with other people    Depression Interventions/Treatment      There were no vitals filed for this visit.  Medications Reviewed Today     Reviewed by Karoline Lima, RN (Registered Nurse) on 11/26/23 at 1315  Med List Status: <None>   Medication Order Taking?  Sig Documenting Provider Last Dose Status Informant  alendronate  (FOSAMAX ) 70 MG tablet [id number]  TAKE 1 TABLET BY MOUTH WEEKLY  WITH 8 OZ OF PLAIN WATER 30  MINUTES BEFORE FIRST FOOD, DRINK OR MEDS. STAY UPRIGHT FOR 30  MINS Edman Marsa PARAS, DO  Active   amitriptyline  (ELAVIL ) 10 MG tablet [id number]  TAKE 1 TABLET(10 MG) BY MOUTH AT BEDTIME Edman Marsa PARAS, DO  Active   ascorbic acid (VITAMIN C) 500 MG tablet [id number]  Take by mouth. [provider]  Active   aspirin 81 MG EC tablet [id number]  Take by mouth. [provider]  Active   azelastine  (ASTELIN ) 0.1 % nasal spray [id number]  Place 2 sprays into both nostrils 2 (two) times daily. Use in each nostril as directed Edman Marsa PARAS, DO  Active   azithromycin  (ZITHROMAX  Z-PAK) 250 MG tablet [id number]  Take 2 tabs (500mg  total) on Day 1. Take 1 tab (250mg ) daily for next 4 days.  Patient not taking: Reported on 09/07/2023   Edman Marsa PARAS, DO  Active   Cholecalciferol 25 MCG (1000 UT) tablet [id number]  Take by mouth. [provider]  Active   DULoxetine  (CYMBALTA ) 30 MG capsule [id number]  TAKE 1 CAPSULE BY MOUTH TWICE  DAILY Karamalegos, Marsa PARAS, DO  Active   fluconazole  (DIFLUCAN ) 150 MG tablet [id number] No Take one tablet by mouth on Day 1. Repeat dose 2nd tablet on Day 3.  Patient not taking: Reported on 06/05/2023   Edman Marsa PARAS, DO Not Taking Active   fluticasone  (FLONASE ) 50 MCG/ACT nasal spray [id number]  USE 2 SPRAYS IN BOTH NOSTRILS  DAILY Edman Marsa PARAS, DO  Active   gabapentin  (NEURONTIN ) 100 MG capsule [id number]  TAKE 1 CAPSULE BY MOUTH TWICE  DAILY Karamalegos, Marsa PARAS, DO  Active   meloxicam  (MOBIC ) 7.5 MG tablet [id number]  Take 1 tablet (7.5 mg total) by mouth daily as needed for pain. Alvia Selinda PARAS, MD  Active   montelukast  (SINGULAIR ) 10 MG tablet [id number]  TAKE 1 TABLET BY MOUTH AT  BEDTIME Edman Marsa PARAS, DO  Active    nystatin  (MYCOSTATIN /NYSTOP ) powder [id number]  Apply 1 Application topically 3 (three) times daily. For 2-4 weeks. May repeat if needed. Edman Marsa PARAS, DO  Active            Med Note HUMBERTO, JHONNIE RAMAN   Fri Aug 25, 2022  2:35 PM) Takes PRN   pantoprazole  (PROTONIX ) 40 MG tablet [id number]  TAKE 1 TABLET BY MOUTH DAILY  BEFORE BREAKFAST Karamalegos, Marsa PARAS, DO  Active   rizatriptan  (MAXALT -MLT) 10 MG disintegrating tablet [id number]  Take 1 tablet (10 mg total) by mouth as needed for migraine. May repeat in 2 hours if needed Edman Marsa PARAS, DO  Active   rosuvastatin  (CRESTOR ) 20 MG tablet [id number]  TAKE 1 TABLET BY MOUTH AT  BEDTIME Edman Marsa PARAS, DO  Active   triamcinolone  (KENALOG ) 0.025 % ointment [id number]  Apply 1 Application topically 2 (two) times daily. Leigh Sober, MD  Active   triamcinolone  ointment (KENALOG ) 0.5 % [id number]  Apply 1 Application topically 2 (two) times daily. For 1-2 weeks affected area only then stop. Edman Marsa PARAS, DO  Active             Recommendation:   {RECOMMENDATONS:32554}  Follow Up Plan:   {FOLLOWUP:32559}  SIG ***

## 2023-11-27 NOTE — Patient Instructions (Signed)
 Thank you for allowing the Complex Care Management team to participate in your care. It was great speaking with you!  We will follow up on December 25, 2023 at 1 pm. Please do not hesitate to contact me if you require assistance prior to our next outreach.   Jackson Acron [facility] Population Health RN Care Manager Direct Dial: [phone]  Fax: [phone] Website: [email or web address]

## 2023-12-06 ENCOUNTER — Ambulatory Visit (INDEPENDENT_AMBULATORY_CARE_PROVIDER_SITE_OTHER)

## 2023-12-06 DIAGNOSIS — Z23 Encounter for immunization: Secondary | ICD-10-CM

## 2023-12-22 ENCOUNTER — Other Ambulatory Visit: Payer: Self-pay | Admitting: Family Medicine

## 2023-12-22 DIAGNOSIS — G43909 Migraine, unspecified, not intractable, without status migrainosus: Secondary | ICD-10-CM

## 2023-12-25 ENCOUNTER — Other Ambulatory Visit: Payer: Self-pay

## 2023-12-25 DIAGNOSIS — G43909 Migraine, unspecified, not intractable, without status migrainosus: Secondary | ICD-10-CM

## 2023-12-25 MED ORDER — RIZATRIPTAN BENZOATE 10 MG PO TBDP: 10.0000 mg | ORAL_TABLET | ORAL | 1 refills | Status: AC | PRN

## 2023-12-25 NOTE — Telephone Encounter (Signed)
 Refilled 12/25/23 # 10 with 1 refill. Requested Prescriptions  Refused Prescriptions Disp Refills   rizatriptan  (MAXALT -MLT) 10 MG disintegrating tablet [Pharmacy Med Name: RIZATRIPTAN  ODT 10MG  TABLETS] 10 tablet 1    Sig: TAKE 1 TABLET BY MOUTH AS NEEDED FOR MIGRAINE. MAY REPEAT IN 2 HOURS IF NEEDED     Neurology:  Migraine Therapy - Triptan Passed - 12/25/2023 12:06 PM      Passed - Last BP in normal range    BP Readings from Last 1 Encounters:  06/05/23 124/70         Passed - Valid encounter within last 12 months    Recent Outpatient Visits           3 months ago Vaginal bleeding   [place] [facility] [place], [place] A, MD   6 months ago Chronic knee pain after total replacement of left knee joint   [place] [facility] [place], Mars[place] PARAS, [place]

## 2023-12-25 NOTE — Patient Outreach (Signed)
 Complex Care Management   Visit Note  12/25/2023  Name:  Gracelynn Bircher MRN: [id number] DOB: [birth year]  Situation: Referral received for Complex Care Management related to COPD I obtained verbal consent from Patient.  Visit completed with Ms. Smyre via telephone.  Background:   Past Medical History:  Diagnosis Date   Allergy    Anxiety    Headache    Hyperlipidemia    Migraine    Sleep apnea      Assessment: Patient Reported Symptoms: Cognitive Cognitive Status: Alert and oriented to person, place, and time, Normal speech and language skills Cognitive/Intellectual Conditions Management [RPT]: None reported or documented in medical history or problem list Health Maintenance Behaviors: Annual physical exam, Sleep adequate, Social activities, Stress management, Healthy diet Healing Pattern: Average Health Facilitated by: Rest, Healthy diet, Stress management  Neurological Neurological Review of Symptoms: No symptoms reported Neurological Management Strategies: Routine screening, Medication therapy, Coping strategies, Adequate rest Neurological Self-Management Outcome: 4 (good)  HEENT HEENT Symptoms Reported: No symptoms reported HEENT Management Strategies: Routine screening HEENT Self-Management Outcome: 4 (good)  Cardiovascular Cardiovascular Symptoms Reported: No symptoms reported Does patient have uncontrolled Hypertension?: No Cardiovascular Management Strategies: Routine screening Cardiovascular Self-Management Outcome: 4 (good)  Respiratory Respiratory Symptoms Reported: No symptoms reported Respiratory Management Strategies: Coping strategies, Breathing exercise, Routine screening Respiratory Self-Management Outcome: 4 (good)  Endocrine Endocrine Symptoms Reported: No symptoms reported Is patient diabetic?: No Endocrine Self-Management Outcome: 4 (good)  Gastrointestinal Gastrointestinal Symptoms Reported: No symptoms reported Gastrointestinal Self-Management  Outcome: 4 (good)  Genitourinary Genitourinary Symptoms Reported: Incontinence Additional Genitourinary Details: Reports episodes of incontinence. Uses incontinence garments/pads Genitourinary Management Strategies: Incontinence garment/pad Genitourinary Self-Management Outcome: 4 (good)  Integumentary Integumentary Symptoms Reported: No symptoms reported Skin Management Strategies: Routine screening Skin Self-Management Outcome: 4 (good)  Musculoskeletal Musculoskelatal Symptoms Reviewed: Unsteady gait Additional Musculoskeletal Details: Reports using rollator walker as needed with ambulatino Musculoskeletal Management Strategies: Routine screening, Medical device, Medication therapy, Coping strategies, Adequate rest Musculoskeletal Self-Management Outcome: 4 (good)  Psychosocial Psychosocial Symptoms Reported: No symptoms reported Quality of Family Relationships: helpful, involved, supportive Do you feel physically threatened by others?: No    12/28/2023    PHQ2-9 Depression Screening   Little interest or pleasure in doing things Not at all  Feeling down, depressed, or hopeless Not at all  PHQ-2 - Total Score 0    There were no vitals filed for this visit. Pain Scale: 0-10 Pain Score:  (Denies complaints of pain at time of call. Currently being followed by [facility])  Medications Reviewed Today     Reviewed by Karoline Lima, RN (Registered Nurse) on 12/28/23 at 0827  Med List Status: <None>   Medication Order Taking? Sig Documenting Provider Last Dose Status Informant  alendronate  (FOSAMAX ) 70 MG tablet [id number]  TAKE 1 TABLET BY MOUTH WEEKLY  WITH 8 OZ OF PLAIN WATER 30  MINUTES BEFORE FIRST FOOD, DRINK OR MEDS. STAY UPRIGHT FOR 30  MINS Edman Marsa PARAS, DO  Active   amitriptyline  (ELAVIL ) 10 MG tablet [id number]  TAKE 1 TABLET(10 MG) BY MOUTH AT BEDTIME Edman Marsa PARAS, DO  Active   ascorbic acid (VITAMIN C) 500 MG tablet [id number]  Take by mouth.  [provider]  Active   aspirin 81 MG EC tablet [id number]  Take by mouth. [provider]  Active   azelastine  (ASTELIN ) 0.1 % nasal spray [id number]  Place 2 sprays into both nostrils 2 (two) times daily. Use in each nostril as directed Edman Marsa PARAS, DO  Active   azithromycin  (ZITHROMAX  Z-PAK) 250 MG tablet [id number]  Take 2 tabs (500mg  total) on Day 1. Take 1 tab (250mg ) daily for next 4 days.  Patient not taking: Reported on 09/07/2023   Edman Marsa PARAS, DO  Active   Cholecalciferol 25 MCG (1000 UT) tablet [id number]  Take by mouth. [provider]  Active   DULoxetine  (CYMBALTA ) 30 MG capsule [id number]  TAKE 1 CAPSULE BY MOUTH TWICE  DAILY Karamalegos, Marsa PARAS, DO  Active   fluconazole  (DIFLUCAN ) 150 MG tablet [id number] No Take one tablet by mouth on Day 1. Repeat dose 2nd tablet on Day 3.  Patient not taking: Reported on 06/05/2023   Edman Marsa PARAS, DO Not Taking Active   fluticasone  (FLONASE ) 50 MCG/ACT nasal spray [id number]  USE 2 SPRAYS IN BOTH NOSTRILS  DAILY Edman Marsa PARAS, DO  Active   gabapentin  (NEURONTIN ) 100 MG capsule [id number]  TAKE 1 CAPSULE BY MOUTH TWICE  DAILY Karamalegos, Marsa PARAS, DO  Active   meloxicam  (MOBIC ) 7.5 MG tablet [id number]  Take 1 tablet (7.5 mg total) by mouth daily as needed for pain. Alvia Selinda PARAS, MD  Active   montelukast  (SINGULAIR ) 10 MG tablet [id number]  TAKE 1 TABLET BY MOUTH AT  BEDTIME Edman Marsa PARAS, DO  Active   nystatin  (MYCOSTATIN /NYSTOP ) powder [id number]  Apply 1 Application topically 3 (three) times daily. For 2-4 weeks. May repeat if needed. Edman Marsa PARAS, DO  Active            Med Note HUMBERTO, JHONNIE RAMAN   Fri Aug 25, 2022  2:35 PM) Takes PRN   pantoprazole  (PROTONIX ) 40 MG tablet [id number]  TAKE 1 TABLET BY MOUTH DAILY  BEFORE BREAKFAST Karamalegos, Marsa PARAS, DO  Active   rizatriptan  (MAXALT -MLT) 10 MG disintegrating tablet [id number]  Take 1  tablet (10 mg total) by mouth as needed for migraine. May repeat in 2 hours if needed Edman Marsa PARAS, DO  Active   rosuvastatin  (CRESTOR ) 20 MG tablet [id number]  TAKE 1 TABLET BY MOUTH AT  BEDTIME Edman Marsa PARAS, DO  Active   triamcinolone  (KENALOG ) 0.025 % ointment [id number]  Apply 1 Application topically 2 (two) times daily. Leigh Sober, MD  Active   triamcinolone  ointment (KENALOG ) 0.5 % [id number]  Apply 1 Application topically 2 (two) times daily. For 1-2 weeks affected area only then stop. Edman Marsa PARAS, DO  Active             Recommendation:   Continue Current Plan of Care  Follow Up Plan:   Patient has met all care management goals. Care Management case will be closed. Patient has been provided contact information should new needs arise.    Jackson Acron [facility] Population Health RN Care Manager Direct Dial: [phone]  Fax: [phone] Website: [email or web address]

## 2023-12-27 ENCOUNTER — Other Ambulatory Visit: Payer: Self-pay | Admitting: Family Medicine

## 2023-12-27 DIAGNOSIS — G43809 Other migraine, not intractable, without status migrainosus: Secondary | ICD-10-CM

## 2023-12-28 NOTE — Patient Instructions (Signed)
 Thank you for allowing the Complex Care Management team to participate in your care. It was great speaking with you!  Keep up the great work managing your care! Please do not hesitate to notify your PCP if your health needs change and you require additional outreach. The care team will gladly assist.   Jackson Acron [facility] Health RN Care Manager Direct Dial: [phone]  Fax: [phone] Website: [email or web address]

## 2023-12-29 NOTE — Telephone Encounter (Signed)
 Discontinued 11/21/22.  Requested Prescriptions  Pending Prescriptions Disp Refills   SUMAtriptan  (IMITREX ) 100 MG tablet [Pharmacy Med Name: SUMATRIPTAN   100MG   TAB] 40 tablet     Sig: TAKE 1 TABLET BY MOUTH ONCE AS  NEEDED FOR MIGRAINE; MAY REPEAT  ONCE IN 2 HOURS IF HEADACHE  PERSISTS; MAX OF 2 TABS IN 24  HOURS     Neurology:  Migraine Therapy - Triptan Passed - 12/29/2023  9:03 AM      Passed - Last BP in normal range    BP Readings from Last 1 Encounters:  06/05/23 124/70         Passed - Valid encounter within last 12 months    Recent Outpatient Visits           3 months ago Vaginal bleeding   [place] [facility] [place], [place] A, MD   6 months ago Chronic knee pain after total replacement of left knee joint   [place] [facility] [place], Marsa PARAS, [place]

## 2024-01-05 ENCOUNTER — Other Ambulatory Visit: Payer: Self-pay | Admitting: Family Medicine

## 2024-01-05 ENCOUNTER — Other Ambulatory Visit: Payer: Self-pay | Admitting: Obstetrics

## 2024-01-05 DIAGNOSIS — G8929 Other chronic pain: Secondary | ICD-10-CM

## 2024-01-05 DIAGNOSIS — M1711 Unilateral primary osteoarthritis, right knee: Secondary | ICD-10-CM

## 2024-01-05 DIAGNOSIS — I863 Vulval varices: Secondary | ICD-10-CM

## 2024-01-05 DIAGNOSIS — J3089 Other allergic rhinitis: Secondary | ICD-10-CM

## 2024-01-05 DIAGNOSIS — F3342 Major depressive disorder, recurrent, in full remission: Secondary | ICD-10-CM

## 2024-01-05 DIAGNOSIS — M810 Age-related osteoporosis without current pathological fracture: Secondary | ICD-10-CM

## 2024-01-09 NOTE — Telephone Encounter (Signed)
 Requested medication (s) are due for refill today: na   Requested medication (s) are on the active medication list: yes   Last refill:  gabapentin - 08/03/23 #200 1 refills, cymbalta - 09/24/23 #200 0 refills, singular- 08/03/23 #100 1 refills, fosamax - 03/01/23 #12 3 refills  Future visit scheduled: yes 01/16/24  Notes to clinic:  pharmacy requesting 1 year supply. Do you want to refill 1 year supply?     Requested Prescriptions  Pending Prescriptions Disp Refills   gabapentin  (NEURONTIN ) 100 MG capsule [Pharmacy Med Name: Gabapentin  100 MG Oral Capsule] 200 capsule 2    Sig: TAKE 1 CAPSULE BY MOUTH TWICE  DAILY     Neurology: Anticonvulsants - gabapentin  Passed - 01/09/2024  1:44 PM      Passed - Cr in normal range and within 360 days    Creat  Date Value Ref Range Status  08/02/2023 0.62 0.60 - 1.00 mg/dL Final         Passed - Completed PHQ-2 or PHQ-9 in the last 360 days      Passed - Valid encounter within last 12 months    Recent Outpatient Visits           3 months ago Vaginal bleeding   [facility] [place], Rondo A, MD   7 months ago Chronic knee pain after total replacement of left knee joint   [place] [facility] [place], Marsa PARAS, DO               DULoxetine  (CYMBALTA ) 30 MG capsule [Pharmacy Med Name: DULoxetine  HCl 30 MG Oral Capsule Delayed Release Particles] 200 capsule 2    Sig: TAKE 1 CAPSULE BY MOUTH TWICE  DAILY     Psychiatry: Antidepressants - SNRI - duloxetine  Passed - 01/09/2024  1:44 PM      Passed - Cr in normal range and within 360 days    Creat  Date Value Ref Range Status  08/02/2023 0.62 0.60 - 1.00 mg/dL Final         Passed - eGFR is 30 or above and within 360 days    GFR, Est African American  Date Value Ref Range Status  05/25/2020 90 > OR = 60 mL/min/1.57m2 Final   GFR, Est Non African American  Date Value Ref Range Status  05/25/2020 77 > OR = 60 mL/min/1.29m2 Final    eGFR  Date Value Ref Range Status  08/02/2023 92 > OR = 60 mL/min/1.75m2 Final         Passed - Completed PHQ-2 or PHQ-9 in the last 360 days      Passed - Last BP in normal range    BP Readings from Last 1 Encounters:  06/05/23 124/70         Passed - Valid encounter within last 6 months    Recent Outpatient Visits           3 months ago Vaginal bleeding   [place] [facility] [place], [place] A, MD   7 months ago Chronic knee pain after total replacement of left knee joint    [facility] [place], Marsa PARAS, DO               montelukast  (SINGULAIR ) 10 MG tablet [Pharmacy Med Name: Montelukast  Sodium 10 MG Oral Tablet] 100 tablet 2    Sig: TAKE 1 TABLET BY MOUTH AT  BEDTIME     Pulmonology:  Leukotriene Inhibitors Passed - 01/09/2024  1:44 PM      Passed - Valid encounter within last 12 months    Recent Outpatient Visits           3 months ago Vaginal bleeding   [place] [facility], [place] A, MD   7 months ago Chronic knee pain after total replacement of left knee joint   [place] [place], Marsa PARAS, DO               alendronate  (FOSAMAX ) 70 MG tablet [Pharmacy Med Name: Alendronate  Sodium 70 MG Oral Tablet] 12 tablet 3    Sig: TAKE 1 TABLET BY MOUTH WEEKLY  WITH 8 OZ OF PLAIN WATER 30  MINUTES BEFORE FIRST FOOD, DRINK OR MEDS. STAY UPRIGHT FOR 30  MINS     Endocrinology:  Bisphosphonates Failed - 01/09/2024  1:44 PM      Failed - Vitamin D  in normal range and within 360 days    Vit D, 25-Hydroxy  Date Value Ref Range Status  06/07/2022 66 30 - 100 ng/mL Final    Comment:    Vitamin D  Status         25-OH Vitamin D : . Deficiency:                    <20 ng/mL Insufficiency:             20 - 29 ng/mL Optimal:                 > or = 30 ng/mL . For 25-OH Vitamin D  testing on patients on  D2-supplementation and patients for whom  quantitation  of D2 and D3 fractions is required, the Quest[employer]ureD(TM) 25-OH VIT D, (D2,D3), LC/MS/MS is recommended: order  code 07111 (patients >67yrs). . See Note 1 . Note 1 . For additional information, please refer to  http://education.Quest[facility].com/faq/FAQ199  (This link is being provided for informational/ educational purposes only.)          Failed - Mg Level in normal range and within 360 days    No results found for: MG       Failed - Phosphate in normal range and within 360 days    No results found for: PHOS       Passed - Ca in normal range and within 360 days    Calcium   Date Value Ref Range Status  08/02/2023 8.7 8.6 - 10.4 mg/dL Final         Passed - Cr in normal range and within 360 days    Creat  Date Value Ref Range Status  08/02/2023 0.62 0.60 - 1.00 mg/dL Final         Passed - eGFR is 30 or above and within 360 days    GFR, Est African American  Date Value Ref Range Status  05/25/2020 90 > OR = 60 mL/min/1.30m2 Final   GFR, Est Non African American  Date Value Ref Range Status  05/25/2020 77 > OR = 60 mL/min/1.40m2 Final   eGFR  Date Value Ref Range Status  08/02/2023 92 > OR = 60 mL/min/1.76m2 Final         Passed - Valid encounter within last 12 months    Recent Outpatient Visits           3 months ago Vaginal bleeding   [place] [facility] [place], Gowen A, MD   7 months ago Chronic knee pain after total replacement  of left knee joint   [place] [facility] [place], Marsa PARAS, DO              Passed - Bone Mineral Density or Dexa Scan completed in the last 2 years

## 2024-01-16 ENCOUNTER — Encounter: Payer: Self-pay | Admitting: Family Medicine

## 2024-01-16 ENCOUNTER — Ambulatory Visit (INDEPENDENT_AMBULATORY_CARE_PROVIDER_SITE_OTHER): Admitting: Family Medicine

## 2024-01-16 VITALS — BP 130/84 | HR 66 | Ht 61.0 in | Wt 196.0 lb

## 2024-01-16 DIAGNOSIS — Z23 Encounter for immunization: Secondary | ICD-10-CM | POA: Diagnosis not present

## 2024-01-16 DIAGNOSIS — R7309 Other abnormal glucose: Secondary | ICD-10-CM | POA: Diagnosis not present

## 2024-01-16 DIAGNOSIS — F3342 Major depressive disorder, recurrent, in full remission: Secondary | ICD-10-CM | POA: Diagnosis not present

## 2024-01-16 DIAGNOSIS — E785 Hyperlipidemia, unspecified: Secondary | ICD-10-CM

## 2024-01-16 DIAGNOSIS — M1711 Unilateral primary osteoarthritis, right knee: Secondary | ICD-10-CM

## 2024-01-16 DIAGNOSIS — I863 Vulval varices: Secondary | ICD-10-CM | POA: Diagnosis not present

## 2024-01-16 DIAGNOSIS — Z0000 Encounter for general adult medical examination without abnormal findings: Secondary | ICD-10-CM | POA: Diagnosis not present

## 2024-01-16 DIAGNOSIS — G43909 Migraine, unspecified, not intractable, without status migrainosus: Secondary | ICD-10-CM | POA: Diagnosis not present

## 2024-01-16 NOTE — Patient Instructions (Addendum)
 Thank you for coming to the office today.  For Mammogram screening for breast cancer   Call the Imaging Center below anytime to schedule your own appointment now that order has been placed.  [facility] [address], [address] [place], [place] [address] Phone: [phone]  Try [place] as needed same as you would the Rizatriptan  or Sumatriptan   AFter that can try Qulipta, once per day as prevention for migraines it is supposed to be an every day medication to stop headaches.  Check Cost coverage on   - Ubrelvy - Qulipta - Nurtec ODT  Please schedule a Follow-up Appointment to: Return in about 6 weeks (around 02/27/2024) for 6 week follow-up Migraines, Arthritis/Neck/Knee, updates.  If you have any other questions or concerns, please feel free to call the office or send a message through MyChart. You may also schedule an earlier appointment if necessary.  Additionally, you may be receiving a survey about your experience at our office within a few days to 1 week by e-mail or mail. We value your feedback.  Marsa Officer, DO [facility], [place]

## 2024-01-16 NOTE — Progress Notes (Signed)
 Subjective:    Patient ID: Diana Mcneil, female    DOB: 1945-09-12, 78 y.o.   MRN: [id number]  Diana Mcneil is a 78 y.o. female presenting on 01/16/2024 for Annual Exam   HPI  Discussed the use of AI scribe software for clinical note transcription with the patient, who gave verbal consent to proceed.  History of Present Illness   Diana Mcneil is a 78 year old female who presents for an annual physical exam and evaluation of left knee post-surgery issues.  Bilateral knee swelling and post-surgical changes - Persistent swelling in both knees. 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 - Concern regarding swelling potentially related to knee replacement. 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  Episodic Migraine headaches - Migraines occur once or twice per week. - Uses rizatriptan  and sumatriptan  for relief. But she alternates, due to low pill count. - Finds combination of both medications effective - Concerned about frequency of medication use and insurance limitations. - Asking about other med options  Neck and spinal pain OA DDD - Neck pain with associated grinding sensations. - Pain localized to the back of the neck and spine. - Frequent use of heating pad for symptom management.  Weight management difficulties - Difficulty managing weight, particularly during the holiday season.   Vulvar Varicose Veins Lymphedema Followed by GYN, on topical therapy, and compression leggings with limited results Previously followed by [facility], 2023-2024, has not returned     Chronic Knee Pain / Osteoarthritis S/p Left Knee TKR (4 yr ago), still has chronic pain    Vulvar Varicose Veins Followed by Dr Leigh   Health Maintenance: Prevnar 20 today     01/16/2024    7:24 PM 12/25/2023    1:33 PM 11/26/2023    1:57 PM  Depression screen PHQ 2/9  Decreased Interest 0 0 0  Down, Depressed, Hopeless 0 0 0   PHQ - 2 Score 0 0 0  Altered sleeping 0    Tired, decreased energy 0    Change in appetite 0    Feeling bad or failure about yourself  0    Trouble concentrating 0    Moving slowly or fidgety/restless 0    Suicidal thoughts 0    PHQ-9 Score 0    Difficult doing work/chores Not difficult at all         08/22/2023    2:43 PM 06/05/2023    3:26 PM 02/05/2023    3:49 PM 10/03/2022    4:15 PM  GAD 7 : Generalized Anxiety Score  Nervous, Anxious, on Edge 1 0 1 0  Control/stop worrying 0 1 2 3   Worry too much - different things 0 1 2 2   Trouble relaxing 0 1 2 1   Restless 0 0 0 0  Easily annoyed or irritable 0 0 1 1  Afraid - awful might happen 0 1 1 1   Total GAD 7 Score 1 4 9 8   Anxiety Difficulty Not difficult at all Not difficult at all  Not difficult at all     Past Medical History:  Diagnosis Date   Allergy    Anxiety    Headache    Hyperlipidemia    Migraine    Sleep apnea    Past Surgical History:  Procedure Laterality Date   ABDOMINAL HYSTERECTOMY     partial   BACK SURGERY  1989   BREAST BIOPSY Left 08/19/2020   Affirm bx-X clip-path pending  HERNIA REPAIR  09/13/2013   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   Social History   Socioeconomic History   Marital status: Single    Spouse name: Not on file   Number of children: Not on file   Years of education: high school   Highest education level: High school graduate  Occupational History   Not on file  Tobacco Use   Smoking status: Former    Current packs/day: 0.50    Average packs/day: 0.5 packs/day for 40.0 years (20.0 ttl pk-yrs)    Types: Cigarettes   Smokeless tobacco: Former  Building Services Engineer status: Never Used  Substance and Sexual Activity   Alcohol use: Never   Drug use: Never   Sexual activity: Yes  Other Topics Concern   Not on file  Social History Narrative   Not on file   Social Drivers of Health   Financial Resource Strain: Low Risk  (09/07/2023)   Overall Financial  Resource Strain (CARDIA)    Difficulty of Paying Living Expenses: Not very hard  Food Insecurity: No Food Insecurity (09/07/2023)   Hunger Vital Sign    Worried About Running Out of Food in the Last Year: Never true    Ran Out of Food in the Last Year: Never true  Transportation Needs: No Transportation Needs (09/07/2023)   PRAPARE - Administrator, Civil Service (Medical): No    Lack of Transportation (Non-Medical): No  Physical Activity: Insufficiently Active (09/07/2023)   Exercise Vital Sign    Days of Exercise per Week: 2 days    Minutes of Exercise per Session: 20 min  Stress: No Stress Concern Present (12/25/2023)   [employer] of Occupational Health - Occupational Stress Questionnaire    Feeling of Stress: Only a little  Social Connections: Moderately Isolated (09/07/2023)   Social Connection and Isolation Panel    Frequency of Communication with Friends and Family: More than three times a week    Frequency of Social Gatherings with Friends and Family: Never    Attends Religious Services: Never    Database Administrator or Organizations: No    Attends Banker Meetings: Never    Marital Status: Living with partner  Intimate Partner Violence: Not At Risk (09/07/2023)   Humiliation, Afraid, Rape, and Kick questionnaire    Fear of Current or Ex-Partner: No    Emotionally Abused: No    Physically Abused: No    Sexually Abused: No   Family History  Problem Relation Age of Onset   Diabetes Mother    Heart disease Mother    Stroke Mother    CAD Mother    Diabetes Father    Diabetes Paternal Grandfather    Current Outpatient Medications on File Prior to Visit  Medication Sig   alendronate  (FOSAMAX ) 70 MG tablet TAKE 1 TABLET BY MOUTH WEEKLY  WITH 8 OZ OF PLAIN WATER 30  MINUTES BEFORE FIRST FOOD, DRINK OR MEDS. STAY UPRIGHT FOR 30  MINS   amitriptyline  (ELAVIL ) 10 MG tablet TAKE 1 TABLET(10 MG) BY MOUTH AT BEDTIME   ascorbic acid (VITAMIN C) 500 MG  tablet Take by mouth.   aspirin 81 MG EC tablet Take by mouth.   azelastine  (ASTELIN ) 0.1 % nasal spray Place 2 sprays into both nostrils 2 (two) times daily. Use in each nostril as directed   Cholecalciferol 25 MCG (1000 UT) tablet Take by mouth.   DULoxetine  (CYMBALTA ) 30 MG capsule TAKE 1 CAPSULE BY MOUTH  TWICE  DAILY   fluticasone  (FLONASE ) 50 MCG/ACT nasal spray USE 2 SPRAYS IN BOTH NOSTRILS  DAILY   gabapentin  (NEURONTIN ) 100 MG capsule TAKE 1 CAPSULE BY MOUTH TWICE  DAILY   meloxicam  (MOBIC ) 15 MG tablet Take 1 PO with meals everyday   montelukast  (SINGULAIR ) 10 MG tablet TAKE 1 TABLET BY MOUTH AT  BEDTIME   nystatin  (MYCOSTATIN /NYSTOP ) powder Apply 1 Application topically 3 (three) times daily. For 2-4 weeks. May repeat if needed.   pantoprazole  (PROTONIX ) 40 MG tablet TAKE 1 TABLET BY MOUTH DAILY  BEFORE BREAKFAST   rizatriptan  (MAXALT -MLT) 10 MG disintegrating tablet Take 1 tablet (10 mg total) by mouth as needed for migraine. May repeat in 2 hours if needed   rosuvastatin  (CRESTOR ) 20 MG tablet TAKE 1 TABLET BY MOUTH AT  BEDTIME   triamcinolone  (KENALOG ) 0.025 % ointment APPLY EXTERNALLY TO THE AFFECTED AREA TWICE DAILY   triamcinolone  ointment (KENALOG ) 0.5 % Apply 1 Application topically 2 (two) times daily. For 1-2 weeks affected area only then stop.   methocarbamol (ROBAXIN) 500 MG tablet Take 500 mg by mouth every 8 (eight) hours as needed for muscle spasms.   oxyCODONE (OXY IR/ROXICODONE) 5 MG immediate release tablet Take 5 mg by mouth every 4 (four) hours as needed. for pain   No current facility-administered medications on file prior to visit.    Review of Systems  Constitutional:  Negative for activity change, appetite change, chills, diaphoresis, fatigue and fever.  HENT:  Negative for congestion and hearing loss.   Eyes:  Negative for visual disturbance.  Respiratory:  Negative for cough, chest tightness, shortness of breath and wheezing.   Cardiovascular:  Negative  for chest pain, palpitations and leg swelling.  Gastrointestinal:  Negative for abdominal pain, constipation, diarrhea, nausea and vomiting.  Genitourinary:  Negative for dysuria, frequency and hematuria.  Musculoskeletal:  Negative for arthralgias and neck pain.  Skin:  Negative for rash.  Neurological:  Negative for dizziness, weakness, light-headedness, numbness and headaches.  Hematological:  Negative for adenopathy.  Psychiatric/Behavioral:  Negative for behavioral problems, dysphoric mood and sleep disturbance.    Per HPI unless specifically indicated above     Objective:    BP 130/84 (BP Location: Right Arm, Patient Position: Sitting, Cuff Size: Normal)   Pulse 66   Ht 5' 1 (1.549 m)   Wt 196 lb (88.9 kg)   SpO2 98%   BMI 37.03 kg/m   Wt Readings from Last 3 Encounters:  01/16/24 196 lb (88.9 kg)  06/05/23 196 lb (88.9 kg)  02/05/23 207 lb (93.9 kg)    Physical Exam Vitals and nursing note reviewed.  Constitutional:      General: She is not in acute distress.    Appearance: She is well-developed. She is obese. She is not diaphoretic.     Comments: Well-appearing, comfortable, cooperative  HENT:     Head: Normocephalic and atraumatic.  Eyes:     General:        Right eye: No discharge.        Left eye: No discharge.     Conjunctiva/sclera: Conjunctivae normal.     Pupils: Pupils are equal, round, and reactive to light.  Neck:     Thyroid : No thyromegaly.     Vascular: No carotid bruit.  Cardiovascular:     Rate and Rhythm: Normal rate and regular rhythm.     Pulses: Normal pulses.     Heart sounds: Normal heart sounds. No murmur heard. Pulmonary:  Effort: Pulmonary effort is normal. No respiratory distress.     Breath sounds: Normal breath sounds. No wheezing or rales.  Abdominal:     General: Bowel sounds are normal. There is no distension.     Palpations: Abdomen is soft. There is no mass.     Tenderness: There is no abdominal tenderness.   Musculoskeletal:        General: No tenderness. Normal range of motion.     Cervical back: Normal range of motion and neck supple.     Right lower leg: Edema present.     Left lower leg: Edema present.     Comments: Upper / Lower Extremities: - Normal muscle tone, strength bilateral upper extremities 5/5, lower extremities 5/5  Lymphadenopathy:     Cervical: No cervical adenopathy.  Skin:    General: Skin is warm and dry.     Findings: No erythema or rash.  Neurological:     Mental Status: She is alert and oriented to person, place, and time.     Comments: Distal sensation intact to light touch all extremities  Psychiatric:        Mood and Affect: Mood normal.        Behavior: Behavior normal.        Thought Content: Thought content normal.     Comments: Well groomed, good eye contact, normal speech and thoughts     Results for orders placed or performed in visit on 08/28/23  Rheumatoid Factor (IgA, IgG, IgM)   Collection Time: 08/28/23  2:26 PM  Result Value Ref Range   Rheumatoid Factor (IgG) <5 <=6 U   Rheumatoid Factor (IgA) <5 <=6 U   Rheumatoid Factor (IgM) <5 <=6 U  ABO   Collection Time: 08/28/23  2:26 PM  Result Value Ref Range   ABO Grouping O   Antibody screen   Collection Time: 08/28/23  2:26 PM  Result Value Ref Range   Antibody Screen NO ANTIBODIES DETECTED       Assessment & Plan:   Problem List Items Addressed This Visit     Episodic migraine   Relevant Medications   oxyCODONE (OXY IR/ROXICODONE) 5 MG immediate release tablet   methocarbamol (ROBAXIN) 500 MG tablet   meloxicam  (MOBIC ) 15 MG tablet   Other Relevant Orders   CBC with Differential/Platelet   Comprehensive metabolic panel with GFR   Hyperlipidemia   Relevant Orders   TSH   Lipid panel   Comprehensive metabolic panel with GFR   Major depression, recurrent, full remission   Morbid obesity (HCC)   Relevant Orders   Lipid panel   Comprehensive metabolic panel with GFR   Primary  osteoarthritis of right knee   Relevant Medications   oxyCODONE (OXY IR/ROXICODONE) 5 MG immediate release tablet   methocarbamol (ROBAXIN) 500 MG tablet   meloxicam  (MOBIC ) 15 MG tablet   Other Relevant Orders   CBC with Differential/Platelet   Comprehensive metabolic panel with GFR   Vulvar varicose veins   Other Visit Diagnoses       Annual physical exam    -  Primary   Relevant Orders   TSH   Lipid panel   Hemoglobin A1c   CBC with Differential/Platelet   Comprehensive metabolic panel with GFR     Need for Streptococcus pneumoniae vaccination       Relevant Orders   Pneumococcal conjugate vaccine 20-valent (Completed)     Abnormal glucose       Relevant Orders  Hemoglobin A1c        Updated Health Maintenance information Encouraged improvement to lifestyle with diet and exercise Goal of weight loss  Major Depression recurrent full remission On Duloxetine  30mg  TWICE A DAY   Episodic migraine Experiences migraines once or twice a week. Insurance limits migraine medication prescriptions, Triptans. Discussed alternative medications, including Ubrelvy and Qulipta, which may be costly. - Provided samples of Ubrelvy and Qulipta for trial use. - Check insurance coverage for newer migraine medications. - She is taking Sumatriptan  and Rizatriptan  (not at same time, will alternate), but has limited quantity of each. Advised her that cannot double up on Triptans. - Consider prescribing alternative migraine medications if insurance coverage is available.  Vulvar varicose veins Persistent vulvar varicose veins despite support hose. Previous vascular specialist consultation.  Uncertainty about management by urologist or gynecologist. She is followed by GYN but has not had success with management thus far. She wears compression but limited results as well. - Curbside Epic Secure Chat question with her previous vascular provider about future management. [facility]  Orvin Daring NP was contacted and suggested that she can get the patient scheduled to evaluate possible pelvic congestion syndrome but they do not do procedural intervention with sclerotherapy for this indication.  Hyperlipidemia Due for routine cholesterol testing. Previous blood work was for hip surgery clearance. On Rosuvastatin  20mg  - Scheduled blood work for cholesterol testing on December 18th, 2025.  General Health Maintenance Due for mammogram and pneumonia vaccine upgrade. Completed flu shot earlier this year. - Schedule mammogram appointment. - Administered Prevnar 20 pneumonia vaccine today.       Orders Placed This Encounter  Procedures   Pneumococcal conjugate vaccine 20-valent   TSH    Standing Status:   Future    Expected Date:   01/24/2024    Expiration Date:   04/23/2024   Lipid panel    Standing Status:   Future    Expected Date:   01/24/2024    Expiration Date:   04/23/2024    Has the patient fasted?:   Yes   Hemoglobin A1c    Standing Status:   Future    Expected Date:   01/24/2024    Expiration Date:   04/23/2024   CBC with Differential/Platelet    Standing Status:   Future    Expected Date:   01/24/2024    Expiration Date:   04/23/2024   Comprehensive metabolic panel with GFR    Standing Status:   Future    Expected Date:   01/24/2024    Expiration Date:   04/23/2024    Has the patient fasted?:   Yes    No orders of the defined types were placed in this encounter.    Follow up plan: Return in about 6 weeks (around 02/27/2024) for 6 week follow-up Migraines, Arthritis/Neck/Knee, updates.  Labs 12/18  Marsa Officer, DO [facility] 01/16/2024, 2:42 PM

## 2024-01-17 ENCOUNTER — Telehealth (INDEPENDENT_AMBULATORY_CARE_PROVIDER_SITE_OTHER): Payer: Self-pay | Admitting: Trauma Surgery

## 2024-01-17 NOTE — Telephone Encounter (Signed)
 Tried to call pt x2. LVM for pt to CB to schedule appointment at [facility]- per secure chat from Dr. Edman.  Fu discuss pelvic congestion syndrome no studies see GS/FB only

## 2024-01-24 ENCOUNTER — Other Ambulatory Visit

## 2024-01-24 DIAGNOSIS — M1711 Unilateral primary osteoarthritis, right knee: Secondary | ICD-10-CM

## 2024-01-24 DIAGNOSIS — G43909 Migraine, unspecified, not intractable, without status migrainosus: Secondary | ICD-10-CM

## 2024-01-24 DIAGNOSIS — R7309 Other abnormal glucose: Secondary | ICD-10-CM

## 2024-01-24 DIAGNOSIS — E785 Hyperlipidemia, unspecified: Secondary | ICD-10-CM

## 2024-01-24 DIAGNOSIS — Z0000 Encounter for general adult medical examination without abnormal findings: Secondary | ICD-10-CM

## 2024-01-25 ENCOUNTER — Ambulatory Visit: Payer: Self-pay | Admitting: Family Medicine

## 2024-01-25 LAB — COMPREHENSIVE METABOLIC PANEL WITH GFR
AG Ratio: 1.8 (calc) (ref 1.0–2.5)
ALT: 10 U/L (ref 6–29)
AST: 19 U/L (ref 10–35)
Albumin: 3.8 g/dL (ref 3.6–5.1)
Alkaline phosphatase (APISO): 75 U/L (ref 37–153)
BUN: 13 mg/dL (ref 7–25)
CO2: 31 mmol/L (ref 20–32)
Calcium: 9.1 mg/dL (ref 8.6–10.4)
Chloride: 104 mmol/L (ref 98–110)
Creat: 0.7 mg/dL (ref 0.60–1.00)
Globulin: 2.1 g/dL (ref 1.9–3.7)
Glucose, Bld: 85 mg/dL (ref 65–99)
Potassium: 4.5 mmol/L (ref 3.5–5.3)
Sodium: 140 mmol/L (ref 135–146)
Total Bilirubin: 0.8 mg/dL (ref 0.2–1.2)
Total Protein: 5.9 g/dL — ABNORMAL LOW (ref 6.1–8.1)
eGFR: 88 mL/min/1.73m2

## 2024-01-25 LAB — HEMOGLOBIN A1C
Hgb A1c MFr Bld: 5.2 %
Mean Plasma Glucose: 103 mg/dL
eAG (mmol/L): 5.7 mmol/L

## 2024-01-25 LAB — CBC WITH DIFFERENTIAL/PLATELET
Absolute Lymphocytes: 1396 {cells}/uL (ref 850–3900)
Absolute Monocytes: 304 {cells}/uL (ref 200–950)
Basophils Absolute: 20 {cells}/uL (ref 0–200)
Basophils Relative: 0.5 %
Eosinophils Absolute: 60 {cells}/uL (ref 15–500)
Eosinophils Relative: 1.5 %
HCT: 43.1 % (ref 35.9–46.0)
Hemoglobin: 13.9 g/dL (ref 11.7–15.5)
MCH: 30 pg (ref 27.0–33.0)
MCHC: 32.3 g/dL (ref 31.6–35.4)
MCV: 92.9 fL (ref 81.4–101.7)
MPV: 11.6 fL (ref 7.5–12.5)
Monocytes Relative: 7.6 %
Neutro Abs: 2220 {cells}/uL (ref 1500–7800)
Neutrophils Relative %: 55.5 %
Platelets: 176 Thousand/uL (ref 140–400)
RBC: 4.64 Million/uL (ref 3.80–5.10)
RDW: 13.2 % (ref 11.0–15.0)
Total Lymphocyte: 34.9 %
WBC: 4 Thousand/uL (ref 3.8–10.8)

## 2024-01-25 LAB — LIPID PANEL
Cholesterol: 150 mg/dL
HDL: 72 mg/dL
LDL Cholesterol (Calc): 62 mg/dL
Non-HDL Cholesterol (Calc): 78 mg/dL
Total CHOL/HDL Ratio: 2.1 (calc)
Triglycerides: 78 mg/dL

## 2024-01-25 LAB — TSH: TSH: 1.47 m[IU]/L (ref 0.40–4.50)

## 2024-02-18 ENCOUNTER — Ambulatory Visit (INDEPENDENT_AMBULATORY_CARE_PROVIDER_SITE_OTHER): Admitting: Vascular Surgery

## 2024-02-24 NOTE — Progress Notes (Unsigned)
 "                              MRN : [id number]  Diana Mcneil is a 79 y.o. (08/21/1945) female who presents with chief complaint of legs swell.  History of Present Illness:   The patient returns to the office for followup evaluation regarding leg swelling.  The swelling has persisted and the pain associated with swelling continues. There have not been any interval development of a ulcerations or wounds.  Since the previous visit the patient has been wearing graduated compression stockings and has noted little if any improvement in the lymphedema. The patient has been using compression routinely morning until night.  The patient also states elevation during the day and exercise is being done too.  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 Vascular:  [] Pain in legs with walking   [x] Pain in legs with standing  [] History of DVT   [] Phlebitis   [x] Swelling in legs   [] Varicose veins   [] Non-healing ulcers Pulmonary:   [] Uses home oxygen   [] Productive cough   [] Hemoptysis   [] Wheeze  [] COPD   [] Asthma Neurologic:  [] Dizziness   [] Seizures   [] History of  stroke   [] History of TIA  [] Aphasia   [] Vissual changes   [] Weakness or numbness in arm   [] Weakness or numbness in leg Musculoskeletal:   [] Joint swelling   [] Joint pain   [] Low back pain Hematologic:  [] Easy bruising  [] Easy bleeding   [] Hypercoagulable state   [] Anemic Gastrointestinal:  [] Diarrhea   [] Vomiting  [] Gastroesophageal reflux/heartburn   [] Difficulty swallowing. Genitourinary:  [] Chronic kidney disease   [] Difficult urination  [] Frequent urination   [] Blood in urine Skin:  [] Rashes   [] Ulcers  Psychological:  [] History of anxiety   []  History of major depression.  Physical Examination  There were no vitals filed for this visit. There is no height or weight on file to calculate BMI. Gen: WD/WN, NAD Head: [place]/AT, No temporalis wasting.  Ear/Nose/Throat: Hearing grossly intact, nares w/o erythema or drainage, pinna without lesions Eyes: PER, EOMI, sclera nonicteric.  Neck: Supple, no gross masses.  No JVD.  Pulmonary:  Good air movement, no audible wheezing, no use of accessory muscles.  Cardiac: RRR, precordium not hyperdynamic. Vascular:  scattered varicosities present bilaterally.  Mild venous stasis changes to the legs bilaterally.  3-4+ soft pitting edema, CEAP C4sEpAsPr  Vessel Right Left  Radial Palpable Palpable  Gastrointestinal:  soft, non-distended. No guarding/no peritoneal signs.  Musculoskeletal: M/S 5/5 throughout.  No deformity.  Neurologic: CN 2-12 intact. Pain and light touch intact in extremities.  Symmetrical.  Speech is fluent. Motor exam as listed above. Psychiatric: Judgment intact, Mood & affect appropriate for pt's clinical situation. Dermatologic: Venous rashes no ulcers noted.  No changes consistent with cellulitis. Lymph : No lichenification or skin changes of chronic lymphedema.  CBC Lab Results  Component Value Date   WBC 4.0 01/24/2024   HGB 13.9 01/24/2024   HCT 43.1 01/24/2024   MCV 92.9 01/24/2024   PLT 176 01/24/2024    BMET     Component Value Date/Time   NA 140 01/24/2024 1324   K 4.5 01/24/2024 1324   CL 104 01/24/2024 1324   CO2 31 01/24/2024 1324   GLUCOSE 85 01/24/2024 1324   BUN 13 01/24/2024 1324   CREATININE 0.70 01/24/2024 1324   CALCIUM  9.1 01/24/2024 1324   GFRNONAA 77 05/25/2020 0925   GFRAA 90 05/25/2020 0925   CrCl cannot be calculated (Patient's most recent lab result is older than the maximum 21 days allowed.).  COAG No results found for: INR, PROTIME  Radiology No results found.   Assessment/Plan There are no diagnoses linked to this encounter.   Cordella Shawl, MD  02/24/2024 2:00 PM      [1]  No outpatient medications have been marked as taking for the 02/25/24 encounter (Appointment) with Shawl, Cordella MATSU, MD.  [2]  Social History Tobacco Use   Smoking status: Former    Current packs/day: 0.50    Average packs/day: 0.5 packs/day for 40.0 years (20.0 ttl pk-yrs)    Types: Cigarettes   Smokeless tobacco: Former  Building Services Engineer status: Never Used  Substance Use Topics   Alcohol use: Never   Drug use: Never  [3]  Allergies Allergen Reactions   Black Pepper-Turmeric     Bell peppers, not black pepper   Mushroom Swelling   Mushroom Extract Complex (Obsolete) Swelling   Piper Diarrhea   Sulfa Antibiotics Hives and Swelling   "

## 2024-02-25 ENCOUNTER — Ambulatory Visit (INDEPENDENT_AMBULATORY_CARE_PROVIDER_SITE_OTHER): Admitting: Vascular Surgery

## 2024-02-25 DIAGNOSIS — J432 Centrilobular emphysema: Secondary | ICD-10-CM

## 2024-02-25 DIAGNOSIS — I890 Lymphedema, not elsewhere classified: Secondary | ICD-10-CM

## 2024-02-25 DIAGNOSIS — E782 Mixed hyperlipidemia: Secondary | ICD-10-CM

## 2024-02-27 ENCOUNTER — Other Ambulatory Visit: Payer: Self-pay | Admitting: Family Medicine

## 2024-02-27 ENCOUNTER — Ambulatory Visit (INDEPENDENT_AMBULATORY_CARE_PROVIDER_SITE_OTHER): Admitting: Family Medicine

## 2024-02-27 ENCOUNTER — Encounter: Payer: Self-pay | Admitting: Family Medicine

## 2024-02-27 VITALS — BP 132/84 | HR 63 | Ht 61.0 in | Wt 213.0 lb

## 2024-02-27 DIAGNOSIS — M1711 Unilateral primary osteoarthritis, right knee: Secondary | ICD-10-CM

## 2024-02-27 DIAGNOSIS — K219 Gastro-esophageal reflux disease without esophagitis: Secondary | ICD-10-CM | POA: Diagnosis not present

## 2024-02-27 DIAGNOSIS — G43909 Migraine, unspecified, not intractable, without status migrainosus: Secondary | ICD-10-CM | POA: Diagnosis not present

## 2024-02-27 DIAGNOSIS — Z6841 Body Mass Index (BMI) 40.0 and over, adult: Secondary | ICD-10-CM

## 2024-02-27 MED ORDER — SUCRALFATE 1 G PO TABS: 1.0000 g | ORAL_TABLET | Freq: Three times a day (TID) | ORAL | 2 refills | Status: AC

## 2024-02-27 MED ORDER — UBRELVY 100 MG PO TABS: ORAL_TABLET | ORAL | Status: AC

## 2024-02-27 NOTE — Patient Instructions (Addendum)
 Thank you for coming to the office today.  Sample Ubrelvy  x 2 Take if you need for migraine Contact if you need us  to try to order Caution it will be expensive.  Double dose Pantoprazole , take one twice a day before meals   Take other prescribed medicine Carafate  (Sucralfate ) as needed up to 4 times daily (3 meals and bedtime)  to coat stomach lining to ease symptoms, if it helps reduce symptoms then it is more likely to be due to acid and/or ulcer.   DIET RECOMMENDATIONS - Avoid spicy, greasy, fried foods, also things like caffeine, dark chocolate, peppermint can worsen - Avoid large meals and late night snacks, also do not go more than 4-5 hours without a snack or meal (not eating will worsen reflux symptoms due to stomach acid) - You may also elevate the head of your bed at night to sleep at very slight incline to help reduce symptoms   If the problem improves but keeps coming back, we can discuss higher dose or longer course at next visit.   If symptoms are worsening or persistent despite treatment or develop any different severe esophagus or abdominal pain, unable to swallow solids or liquids, nausea, vomiting especially blood in vomit, fever/chills, or unintentional weight loss / no appetite, please follow-up sooner in office or seek more immediate medical attention at hospital Emergency Department.  Regarding other medicines:   - STOP taking Ibuprofen, Advil, Motrin, Goody's / BC powder - DO NOT take without discussing with your doctor. These medicines can put you at high risk for future bleeding.  If need pain medicine, may take Tylenol Extra Strength (Acetaminophen) 500mg  tabs - take 1 to 2 tabs per dose (max 1000mg ) every 6-8 hours for pain (take regularly, don't skip a dose for next 7 days), max 24 hour daily dose is 6 tablets or 3000mg . In the future you can repeat the same everyday Tylenol course for 1-2 weeks at a time.   Please schedule a Follow-up Appointment to: Return  in about 3 months (around 05/27/2024) for 3 month follow-up GERD/PUD, MIgraines, specialists.  If you have any other questions or concerns, please feel free to call the office or send a message through MyChart. You may also schedule an earlier appointment if necessary.  Additionally, you may be receiving a survey about your experience at our office within a few days to 1 week by e-mail or mail. We value your feedback.  Marsa Officer, DO [facility], [place]

## 2024-02-27 NOTE — Progress Notes (Unsigned)
 "  Subjective:    Patient ID: Diana Mcneil, female    DOB: 09/21/1945, 79 y.o.   MRN: [id number]  Diana Mcneil is a 79 y.o. female presenting on 02/27/2024 for Medical Management of Chronic Issues   HPI  Discussed the use of AI scribe software for clinical note transcription with the patient, who gave verbal consent to proceed.  History of Present Illness   Diana Mcneil is a 79 year old female who presents with burning stomach pain after meals.  Epigastric pain and dyspepsia - Burning sensation in the stomach, described as 'a fire going on', primarily occurring after supper while performing activities such as cleaning the kitchen - Symptoms are absent during the day but anticipated before suppertime - Pantoprazole  40 mg daily is currently taken but is not effective in managing symptoms; no dose increase attempted - No recent changes in medication regimen or increase in anti-inflammatory use - Attempts at symptom relief with milk, ice cream, and Pepto Bismol provide some relief but require considerable time to be effective - Discomfort severe enough to impact ability to attend medical appointments  Abdominal bloating and distention - Bloating and significant abdominal distention, with the stomach swelling up considerably - No blood in stools  Nocturnal symptoms and sleep position - Elevates head while sleeping to mitigate symptoms - Often reclines due to hip and knee pain  Medication use and allergies - Takes 81 mg aspirin nightly - No use of Tylenol, meloxicam , ibuprofen, Aleve, or Advil  Psychological stress - Attributes some digestive issues to psychological stress, identifying as a 'stressful type person'  Episodic Migraine Migraine management - History of migraines, previously treated with Ubrelvy  and Qulipta - Prefers Ubrelvy  for as-needed use - No migraines since starting these medications - Awaiting new insurance card from [employer], which may affect ability to  afford medications     Bilateral knee swelling and post-surgical changes - Persistent swelling in both knees. 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 - Concern regarding swelling potentially related to knee replacement. 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 Has Meloxicam  AS NEEDED  Morbid Obesity BMI >40  Weight management difficulties - Difficulty managing weight, particularly during the holiday season.         01/16/2024    7:24 PM 12/25/2023    1:33 PM 11/26/2023    1:57 PM  Depression screen PHQ 2/9  Decreased Interest 0 0 0  Down, Depressed, Hopeless 0 0 0  PHQ - 2 Score 0 0 0  Altered sleeping 0    Tired, decreased energy 0    Change in appetite 0    Feeling bad or failure about yourself  0    Trouble concentrating 0    Moving slowly or fidgety/restless 0    Suicidal thoughts 0    PHQ-9 Score 0    Difficult doing work/chores Not difficult at all         08/22/2023    2:43 PM 06/05/2023    3:26 PM 02/05/2023    3:49 PM 10/03/2022    4:15 PM  GAD 7 : Generalized Anxiety Score  Nervous, Anxious, on Edge 1  0  1  0   Control/stop worrying 0  1  2  3    Worry too much - different things 0  1  2  2    Trouble relaxing 0  1  2  1    Restless 0  0  0  0   Easily annoyed  or irritable 0  0  1  1   Afraid - awful might happen 0  1  1  1    Total GAD 7 Score 1 4 9 8   Anxiety Difficulty Not difficult at all Not difficult at all  Not difficult at all     Data saved with a previous flowsheet row definition    Social History[1]  Review of Systems Per HPI unless specifically indicated above     Objective:    BP 132/84 (BP Location: Right Arm, Patient Position: Sitting, Cuff Size: Normal)   Pulse 63   Ht 5' 1 (1.549 m)   Wt 213 lb (96.6 kg)   SpO2 98%   BMI 40.25 kg/m   Wt Readings from Last 3 Encounters:  02/27/24 213 lb (96.6 kg)  01/16/24 196 lb (88.9 kg)  06/05/23 196 lb (88.9 kg)    Physical Exam Vitals  and nursing note reviewed.  Constitutional:      General: She is not in acute distress.    Appearance: She is well-developed. She is obese. She is not diaphoretic.     Comments: Well-appearing, comfortable, cooperative  HENT:     Head: Normocephalic and atraumatic.  Eyes:     General:        Right eye: No discharge.        Left eye: No discharge.     Conjunctiva/sclera: Conjunctivae normal.     Pupils: Pupils are equal, round, and reactive to light.  Neck:     Thyroid : No thyromegaly.     Vascular: No carotid bruit.  Cardiovascular:     Rate and Rhythm: Normal rate and regular rhythm.     Pulses: Normal pulses.     Heart sounds: Normal heart sounds. No murmur heard. Pulmonary:     Effort: Pulmonary effort is normal. No respiratory distress.     Breath sounds: Normal breath sounds. No wheezing or rales.  Abdominal:     General: Bowel sounds are normal. There is no distension.     Palpations: Abdomen is soft. There is no mass.     Tenderness: There is no abdominal tenderness.  Musculoskeletal:        General: No tenderness. Normal range of motion.     Cervical back: Normal range of motion and neck supple.     Right lower leg: Edema present.     Left lower leg: Edema present.     Comments: Upper / Lower Extremities: - Normal muscle tone, strength bilateral upper extremities 5/5, lower extremities 5/5  Lymphadenopathy:     Cervical: No cervical adenopathy.  Skin:    General: Skin is warm and dry.     Findings: No erythema or rash.  Neurological:     Mental Status: She is alert and oriented to person, place, and time.     Comments: Distal sensation intact to light touch all extremities  Psychiatric:        Mood and Affect: Mood normal.        Behavior: Behavior normal.        Thought Content: Thought content normal.     Comments: Well groomed, good eye contact, normal speech and thoughts     Results for orders placed or performed in visit on 01/24/24  Comprehensive  metabolic panel with GFR   Collection Time: 01/24/24  1:24 PM  Result Value Ref Range   Glucose, Bld 85 65 - 99 mg/dL   BUN 13 7 - 25 mg/dL  Creat 0.70 0.60 - 1.00 mg/dL   eGFR 88 > OR = 60 fO/fpw/8.26f7   BUN/Creatinine Ratio SEE NOTE: 6 - 22 (calc)   Sodium 140 135 - 146 mmol/L   Potassium 4.5 3.5 - 5.3 mmol/L   Chloride 104 98 - 110 mmol/L   CO2 31 20 - 32 mmol/L   Calcium  9.1 8.6 - 10.4 mg/dL   Total Protein 5.9 (L) 6.1 - 8.1 g/dL   Albumin 3.8 3.6 - 5.1 g/dL   Globulin 2.1 1.9 - 3.7 g/dL (calc)   AG Ratio 1.8 1.0 - 2.5 (calc)   Total Bilirubin 0.8 0.2 - 1.2 mg/dL   Alkaline phosphatase (APISO) 75 37 - 153 U/L   AST 19 10 - 35 U/L   ALT 10 6 - 29 U/L  CBC with Differential/Platelet   Collection Time: 01/24/24  1:24 PM  Result Value Ref Range   WBC 4.0 3.8 - 10.8 Thousand/uL   RBC 4.64 3.80 - 5.10 Million/uL   Hemoglobin 13.9 11.7 - 15.5 g/dL   HCT 56.8 64.0 - 53.9 %   MCV 92.9 81.4 - 101.7 fL   MCH 30.0 27.0 - 33.0 pg   MCHC 32.3 31.6 - 35.4 g/dL   RDW 86.7 88.9 - 84.9 %   Platelets 176 140 - 400 Thousand/uL   MPV 11.6 7.5 - 12.5 fL   Neutro Abs 2,220 1,500 - 7,800 cells/uL   Absolute Lymphocytes 1,396 850 - 3,900 cells/uL   Absolute Monocytes 304 200 - 950 cells/uL   Eosinophils Absolute 60 15 - 500 cells/uL   Basophils Absolute 20 0 - 200 cells/uL   Neutrophils Relative % 55.5 %   Total Lymphocyte 34.9 %   Monocytes Relative 7.6 %   Eosinophils Relative 1.5 %   Basophils Relative 0.5 %  Hemoglobin A1c   Collection Time: 01/24/24  1:24 PM  Result Value Ref Range   Hgb A1c MFr Bld 5.2 <5.7 %   Mean Plasma Glucose 103 mg/dL   eAG (mmol/L) 5.7 mmol/L  Lipid panel   Collection Time: 01/24/24  1:24 PM  Result Value Ref Range   Cholesterol 150 <200 mg/dL   HDL 72 > OR = 50 mg/dL   Triglycerides 78 <849 mg/dL   LDL Cholesterol (Calc) 62 mg/dL (calc)   Total CHOL/HDL Ratio 2.1 <5.0 (calc)   Non-HDL Cholesterol (Calc) 78 <869 mg/dL (calc)  TSH   Collection  Time: 01/24/24  1:24 PM  Result Value Ref Range   TSH 1.47 0.40 - 4.50 mIU/L      Assessment & Plan:   Problem List Items Addressed This Visit     Episodic migraine - Primary   Relevant Medications   Ubrogepant  (UBRELVY ) 100 MG TABS   Primary osteoarthritis of right knee   Other Visit Diagnoses       Gastroesophageal reflux disease without esophagitis       Relevant Medications   sucralfate  (CARAFATE ) 1 g tablet   pantoprazole  (PROTONIX ) 40 MG tablet     Morbid obesity with BMI of 40.0-44.9, adult (HCC)            Gastroesophageal reflux disease Burning sensation post-supper, bloating, distention, and gas suggest GERD. Stress may contribute. No NSAID use or bleeding. Peptic ulcer disease considered but not confirmed without endoscopy. - Double pantoprazole  40mg  to twice daily for 1-3 months. - Prescribed sucralfate  with or after meals 3-4 times daily. - Advised to elevate head of bed and avoid lying back. - Instructed to report  back in 4 weeks on treatment effectiveness. - Consider GI referral if symptoms persist.  Episodic migraine Migraines controlled with Ubrelvy  and Qulipta. Prefers Ubrelvy  as needed. Insurance change may affect costs. - Provided two Ubrelvy  samples. 100mg  ea - Advised Ubrelvy  coverage is uncertain, and she is waiting full insurance coverage in March 2026, will hold off on ordering now and wait - Advised to contact office if medication needed. - Discussed deductible coverage and cost implications.      Morbid Obesity BMI >40 Weight gain in past 6+ months Limited activity currently, goal to improve lifestyle modification   No orders of the defined types were placed in this encounter.   Meds ordered this encounter  Medications   Ubrogepant  (UBRELVY ) 100 MG TABS    Sig: Take 1 daily as needed for migraine headache. May repeat within 2 hours max dose in 24 hours.   sucralfate  (CARAFATE ) 1 g tablet    Sig: Take 1 tablet (1 g total) by mouth 4  (four) times daily -  with meals and at bedtime.    Dispense:  90 tablet    Refill:  2    Follow up plan: Return in about 3 months (around 05/27/2024) for 3 month follow-up GERD/PUD, MIgraines, specialists.  Marsa Officer, DO [facility] 02/27/2024, 1:57 PM     [1]  Social History Tobacco Use   Smoking status: Former    Current packs/day: 0.50    Average packs/day: 0.5 packs/day for 40.0 years (20.0 ttl pk-yrs)    Types: Cigarettes   Smokeless tobacco: Former  Advertising Account Planner   Vaping status: Never Used  Substance Use Topics   Alcohol use: Never   Drug use: Never   "

## 2024-02-28 NOTE — Telephone Encounter (Signed)
 Duplicate request, refilled 02/27/24.  Requested Prescriptions  Pending Prescriptions Disp Refills   sucralfate  (CARAFATE ) 1 g tablet [Pharmacy Med Name: SUCRALFATE  1GM TABLETS] 368 tablet     Sig: TAKE 1 TABLET(1 GRAM) BY MOUTH FOUR TIMES DAILY AT BEDTIME WITH MEALS     Gastroenterology: Antiacids Passed - 02/28/2024  8:23 AM      Passed - Valid encounter within last 12 months    Recent Outpatient Visits           Yesterday Episodic migraine   [place] [facility] [place] Marsa PARAS, DO   1 month ago Annual physical exam   [place] [facility] [place] Marsa PARAS, DO   5 months ago Vaginal bleeding   [place] [facility] [place], [place] A, MD   8 months ago Chronic knee pain after total replacement of left knee joint   [place] [facility] [place], Marsa PARAS, [place]

## 2024-03-03 ENCOUNTER — Ambulatory Visit (INDEPENDENT_AMBULATORY_CARE_PROVIDER_SITE_OTHER): Admitting: Vascular Surgery

## 2024-03-10 NOTE — Progress Notes (Unsigned)
 "                              MRN : [id number]  Diana Mcneil is a 79 y.o. (Nov 08, 1945) female who presents with chief complaint of legs swell.  History of Present Illness:   The patient returns to the office for followup evaluation regarding leg swelling.  The swelling has persisted and the pain associated with swelling continues. There have not been any interval development of a ulcerations or wounds.  Since the previous visit the patient has been wearing graduated compression stockings and has noted little if any improvement in the lymphedema. The patient has been using compression routinely morning until night.  The patient also states elevation during the day and exercise is being done too.  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 Vascular:  [] Pain in legs with walking   [x] Pain in legs with standing  [] History of DVT   [] Phlebitis   [x] Swelling in legs   [] Varicose veins   [] Non-healing ulcers Pulmonary:   [] Uses home oxygen   [] Productive cough   [] Hemoptysis   [] Wheeze  [] COPD   [] Asthma Neurologic:  [] Dizziness   [] Seizures   [] History of  stroke   [] History of TIA  [] Aphasia   [] Vissual changes   [] Weakness or numbness in arm   [] Weakness or numbness in leg Musculoskeletal:   [] Joint swelling   [] Joint pain   [] Low back pain Hematologic:  [] Easy bruising  [] Easy bleeding   [] Hypercoagulable state   [] Anemic Gastrointestinal:  [] Diarrhea   [] Vomiting  [] Gastroesophageal reflux/heartburn   [] Difficulty swallowing. Genitourinary:  [] Chronic kidney disease   [] Difficult urination  [] Frequent urination   [] Blood in urine Skin:  [] Rashes   [] Ulcers  Psychological:  [] History of anxiety   []  History of major depression.  Physical Examination  There were no vitals filed for this visit. There is no height or weight on file to calculate BMI. Gen: WD/WN, NAD Head: [place]/AT, No temporalis wasting.  Ear/Nose/Throat: Hearing grossly intact, nares w/o erythema or drainage, pinna without lesions Eyes: PER, EOMI, sclera nonicteric.  Neck: Supple, no gross masses.  No JVD.  Pulmonary:  Good air movement, no audible wheezing, no use of accessory muscles.  Cardiac: RRR, precordium not hyperdynamic. Vascular:  scattered varicosities present bilaterally.  Mild venous stasis changes to the legs bilaterally.  3-4+ soft pitting edema, CEAP C4sEpAsPr  Vessel Right Left  Radial Palpable Palpable  Gastrointestinal:  soft, non-distended. No guarding/no peritoneal signs.  Musculoskeletal: M/S 5/5 throughout.  No deformity.  Neurologic: CN 2-12 intact. Pain and light touch intact in extremities.  Symmetrical.  Speech is fluent. Motor exam as listed above. Psychiatric: Judgment intact, Mood & affect appropriate for pt's clinical situation. Dermatologic: Venous rashes no ulcers noted.  No changes consistent with cellulitis. Lymph : No lichenification or skin changes of chronic lymphedema.  CBC Lab Results  Component Value Date   WBC 4.0 01/24/2024   HGB 13.9 01/24/2024   HCT 43.1 01/24/2024   MCV 92.9 01/24/2024   PLT 176 01/24/2024    BMET     Component Value Date/Time   NA 140 01/24/2024 1324   K 4.5 01/24/2024 1324   CL 104 01/24/2024 1324   CO2 31 01/24/2024 1324   GLUCOSE 85 01/24/2024 1324   BUN 13 01/24/2024 1324   CREATININE 0.70 01/24/2024 1324   CALCIUM  9.1 01/24/2024 1324   GFRNONAA 77 05/25/2020 0925   GFRAA 90 05/25/2020 0925   CrCl cannot be calculated (Patient's most recent lab result is older than the maximum 21 days allowed.).  COAG No results found for: INR, PROTIME  Radiology No results found.   Assessment/Plan There are no diagnoses linked to this encounter.   Cordella Shawl, MD  03/10/2024 11:18 AM      [1]  No outpatient medications have been marked as taking for the 03/13/24 encounter (Appointment) with Shawl, Cordella MATSU, MD.  [2]  Social History Tobacco Use   Smoking status: Former    Current packs/day: 0.50    Average packs/day: 0.5 packs/day for 40.0 years (20.0 ttl pk-yrs)    Types: Cigarettes   Smokeless tobacco: Former  Building Services Engineer status: Never Used  Substance Use Topics   Alcohol use: Never   Drug use: Never  [3]  Allergies Allergen Reactions   Black Pepper-Turmeric     Bell peppers, not black pepper   Mushroom Swelling   Mushroom Extract Complex (Obsolete) Swelling   Oxycodone Hives and Itching   Piper Diarrhea   Sulfa Antibiotics Hives and Swelling   "

## 2024-03-13 ENCOUNTER — Ambulatory Visit (INDEPENDENT_AMBULATORY_CARE_PROVIDER_SITE_OTHER): Admitting: Vascular Surgery

## 2024-03-13 DIAGNOSIS — J432 Centrilobular emphysema: Secondary | ICD-10-CM

## 2024-03-13 DIAGNOSIS — I890 Lymphedema, not elsewhere classified: Secondary | ICD-10-CM

## 2024-03-13 DIAGNOSIS — E782 Mixed hyperlipidemia: Secondary | ICD-10-CM

## 2024-03-13 DIAGNOSIS — M150 Primary generalized (osteo)arthritis: Secondary | ICD-10-CM

## 2024-06-02 ENCOUNTER — Ambulatory Visit: Admitting: Family Medicine

## 2024-09-19 ENCOUNTER — Ambulatory Visit

## 2024-09-24 ENCOUNTER — Ambulatory Visit
